# Patient Record
Sex: Female | Born: 1970 | Race: Black or African American | Hispanic: No | Smoking: Current every day smoker
Health system: Southern US, Community
[De-identification: ages and names within clinical notes are randomized; demographics above are authoritative.]

## PROBLEM LIST (undated history)

## (undated) ENCOUNTER — Inpatient Hospital Stay (HOSPITAL_COMMUNITY): Payer: Self-pay

## (undated) DIAGNOSIS — I471 Supraventricular tachycardia, unspecified: Secondary | ICD-10-CM

## (undated) DIAGNOSIS — F419 Anxiety disorder, unspecified: Secondary | ICD-10-CM

## (undated) DIAGNOSIS — I10 Essential (primary) hypertension: Secondary | ICD-10-CM

## (undated) DIAGNOSIS — M545 Low back pain, unspecified: Secondary | ICD-10-CM

## (undated) DIAGNOSIS — F32A Depression, unspecified: Secondary | ICD-10-CM

## (undated) DIAGNOSIS — F329 Major depressive disorder, single episode, unspecified: Secondary | ICD-10-CM

## (undated) HISTORY — PX: THERAPEUTIC ABORTION: SHX798

---

## 1999-03-16 ENCOUNTER — Other Ambulatory Visit: Admission: RE | Admit: 1999-03-16 | Discharge: 1999-03-16 | Payer: Self-pay | Admitting: Obstetrics and Gynecology

## 2000-04-08 ENCOUNTER — Emergency Department (HOSPITAL_COMMUNITY): Admission: EM | Admit: 2000-04-08 | Discharge: 2000-04-08 | Payer: Self-pay | Admitting: Emergency Medicine

## 2003-03-16 ENCOUNTER — Other Ambulatory Visit: Admission: RE | Admit: 2003-03-16 | Discharge: 2003-03-16 | Payer: Self-pay | Admitting: Obstetrics and Gynecology

## 2003-03-17 ENCOUNTER — Other Ambulatory Visit: Admission: RE | Admit: 2003-03-17 | Discharge: 2003-03-17 | Payer: Self-pay | Admitting: Obstetrics and Gynecology

## 2004-05-29 ENCOUNTER — Encounter: Admission: RE | Admit: 2004-05-29 | Discharge: 2004-05-29 | Payer: Self-pay | Admitting: Neurosurgery

## 2006-06-14 ENCOUNTER — Ambulatory Visit: Payer: Self-pay | Admitting: Internal Medicine

## 2006-06-14 LAB — CONVERTED CEMR LAB
ALT: 15 U/L (ref 0–35)
AST: 13 U/L (ref 0–37)
Albumin: 4.1 g/dL (ref 3.5–5.2)
Alkaline Phosphatase: 67 U/L (ref 39–117)
BUN: 8 mg/dL (ref 6–23)
Basophils Absolute: 0 K/uL (ref 0.0–0.1)
Basophils Relative: 1 % (ref 0–1)
Bilirubin, Direct: 0.1 mg/dL (ref 0.0–0.3)
CO2: 25 meq/L (ref 19–32)
Calcium: 8.9 mg/dL (ref 8.4–10.5)
Chloride: 104 meq/L (ref 96–112)
Creatinine, Ser: 0.63 mg/dL (ref 0.40–1.20)
Eosinophils Absolute: 0.2 K/uL (ref 0.0–0.7)
Eosinophils Relative: 3 % (ref 0–5)
Glucose, Bld: 77 mg/dL (ref 70–99)
HCT: 43.2 % (ref 36.0–46.0)
Hemoglobin: 13.8 g/dL (ref 12.0–15.0)
Indirect Bilirubin: 0.4 mg/dL (ref 0.0–0.9)
Lymphocytes Relative: 44 % (ref 12–46)
Lymphs Abs: 2.5 K/uL (ref 0.7–3.3)
MCHC: 31.9 g/dL (ref 30.0–36.0)
MCV: 95.6 fL (ref 78.0–100.0)
Monocytes Absolute: 0.5 K/uL (ref 0.2–0.7)
Monocytes Relative: 8 % (ref 3–11)
Neutro Abs: 2.5 K/uL (ref 1.7–7.7)
Neutrophils Relative %: 44 % (ref 43–77)
Platelets: 365 K/uL (ref 150–400)
Potassium: 4.3 meq/L (ref 3.5–5.3)
RBC: 4.52 M/uL (ref 3.87–5.11)
RDW: 12.4 % (ref 11.5–14.0)
Sodium: 140 meq/L (ref 135–145)
TSH: 1.133 u[IU]/mL (ref 0.350–5.50)
Total Bilirubin: 0.5 mg/dL (ref 0.3–1.2)
Total Protein: 6.7 g/dL (ref 6.0–8.3)
WBC: 5.7 10*3/microliter (ref 4.0–10.5)

## 2006-06-27 ENCOUNTER — Encounter: Payer: Self-pay | Admitting: Cardiology

## 2006-06-27 ENCOUNTER — Ambulatory Visit: Payer: Self-pay

## 2006-07-04 ENCOUNTER — Ambulatory Visit: Payer: Self-pay | Admitting: Internal Medicine

## 2006-08-02 ENCOUNTER — Encounter: Payer: Self-pay | Admitting: Internal Medicine

## 2006-08-02 ENCOUNTER — Ambulatory Visit: Payer: Self-pay | Admitting: Internal Medicine

## 2006-08-02 DIAGNOSIS — R030 Elevated blood-pressure reading, without diagnosis of hypertension: Secondary | ICD-10-CM

## 2006-08-02 DIAGNOSIS — G43909 Migraine, unspecified, not intractable, without status migrainosus: Secondary | ICD-10-CM | POA: Insufficient documentation

## 2006-08-02 DIAGNOSIS — F411 Generalized anxiety disorder: Secondary | ICD-10-CM | POA: Insufficient documentation

## 2006-09-04 ENCOUNTER — Ambulatory Visit: Payer: Self-pay | Admitting: Internal Medicine

## 2006-09-04 ENCOUNTER — Encounter: Payer: Self-pay | Admitting: Internal Medicine

## 2006-09-04 LAB — CONVERTED CEMR LAB
Calcium: 9 mg/dL (ref 8.4–10.5)
Chloride: 104 meq/L (ref 96–112)
GFR calc non Af Amer: 100 mL/min
Glucose, Bld: 74 mg/dL (ref 70–99)

## 2011-01-12 ENCOUNTER — Emergency Department (HOSPITAL_COMMUNITY)
Admission: EM | Admit: 2011-01-12 | Discharge: 2011-01-12 | Disposition: A | Discharge status: Home / Self Care | Payer: Self-pay | Attending: Emergency Medicine | Admitting: Emergency Medicine

## 2011-01-12 ENCOUNTER — Emergency Department (HOSPITAL_COMMUNITY): Payer: Self-pay

## 2011-01-12 DIAGNOSIS — R109 Unspecified abdominal pain: Secondary | ICD-10-CM | POA: Insufficient documentation

## 2011-01-12 DIAGNOSIS — R0789 Other chest pain: Secondary | ICD-10-CM | POA: Insufficient documentation

## 2011-01-12 DIAGNOSIS — R05 Cough: Secondary | ICD-10-CM | POA: Insufficient documentation

## 2011-01-12 DIAGNOSIS — R059 Cough, unspecified: Secondary | ICD-10-CM | POA: Insufficient documentation

## 2011-01-12 DIAGNOSIS — J189 Pneumonia, unspecified organism: Secondary | ICD-10-CM | POA: Insufficient documentation

## 2011-01-12 LAB — POCT PREGNANCY, URINE: Preg Test, Ur: NEGATIVE

## 2011-01-12 LAB — COMPREHENSIVE METABOLIC PANEL
ALT: 16 U/L (ref 0–35)
AST: 12 U/L (ref 0–37)
Albumin: 3.6 g/dL (ref 3.5–5.2)
Alkaline Phosphatase: 92 U/L (ref 39–117)
Potassium: 3.2 mEq/L — ABNORMAL LOW (ref 3.5–5.1)
Sodium: 138 mEq/L (ref 135–145)
Total Protein: 7.5 g/dL (ref 6.0–8.3)

## 2011-01-12 LAB — DIFFERENTIAL
Eosinophils Relative: 2 % (ref 0–5)
Lymphocytes Relative: 24 % (ref 12–46)
Lymphs Abs: 2.4 10*3/uL (ref 0.7–4.0)
Monocytes Relative: 11 % (ref 3–12)
Neutro Abs: 6.2 10*3/uL (ref 1.7–7.7)

## 2011-01-12 LAB — CBC
HCT: 39.4 % (ref 36.0–46.0)
Hemoglobin: 13.1 g/dL (ref 12.0–15.0)
MCH: 30.8 pg (ref 26.0–34.0)
MCHC: 33.2 g/dL (ref 30.0–36.0)
RBC: 4.26 MIL/uL (ref 3.87–5.11)

## 2011-01-12 LAB — URINALYSIS, ROUTINE W REFLEX MICROSCOPIC
Hgb urine dipstick: NEGATIVE
Leukocytes, UA: NEGATIVE
Nitrite: NEGATIVE
Specific Gravity, Urine: 1.035 — ABNORMAL HIGH (ref 1.005–1.030)
Urobilinogen, UA: 0.2 mg/dL (ref 0.0–1.0)

## 2011-09-11 ENCOUNTER — Emergency Department (HOSPITAL_COMMUNITY): Admission: EM | Admit: 2011-09-11 | Discharge: 2011-09-11 | Discharge status: Against Medical Advice | Payer: Self-pay

## 2011-09-11 NOTE — Progress Notes (Signed)
Pt listed as self pay with no insurance coverage Pt confirms she is self pay [place] resident.  CM and [facility] community liaison spoke with her Pt offered [facility] services to assist with finding a [place] self pay provider Pt states she went to [facility] prior to coming to [facility] ED with a form of [employer] coverage per pt Accepted [employer] information

## 2011-09-12 ENCOUNTER — Encounter (HOSPITAL_COMMUNITY): Payer: Self-pay | Admitting: Emergency Medicine

## 2011-09-12 ENCOUNTER — Emergency Department (INDEPENDENT_AMBULATORY_CARE_PROVIDER_SITE_OTHER)
Admission: EM | Admit: 2011-09-12 | Discharge: 2011-09-12 | Disposition: A | Discharge status: Home / Self Care | Payer: Self-pay | Source: Home / Self Care | Attending: Family Medicine | Admitting: Family Medicine

## 2011-09-12 DIAGNOSIS — I10 Essential (primary) hypertension: Secondary | ICD-10-CM

## 2011-09-12 HISTORY — DX: Essential (primary) hypertension: I10

## 2011-09-12 LAB — POCT I-STAT, CHEM 8
BUN: 9 mg/dL (ref 6–23)
Creatinine, Ser: 0.8 mg/dL (ref 0.50–1.10)
Glucose, Bld: 85 mg/dL (ref 70–99)
Hemoglobin: 14.6 g/dL (ref 12.0–15.0)
Potassium: 3.8 mEq/L (ref 3.5–5.1)

## 2011-09-12 MED ORDER — LISINOPRIL-HYDROCHLOROTHIAZIDE 20-12.5 MG PO TABS: 1.0000 | ORAL_TABLET | Freq: Every day | ORAL | Status: DC

## 2011-09-12 NOTE — ED Notes (Signed)
C/o of headache on and off for a month . jSeen by OBGYN on Friday and was told that her b/p was high

## 2011-09-12 NOTE — Discharge Instructions (Signed)
Take medicine as prescribed, no more smoking, reduce salt use , see your doctor for recheck.Go to www.goodrx.com to look up your medications. This will give you a list of where you can find your prescriptions at the most affordable prices.   Call [facility]  [phone]  If you have no primary doctor, here are some resources that may be helpful:  Medicaid-accepting [facility] Providers:   - [facility]- [address]      [phone]      Mon-Fri 9am-7pm, Sat 9am-1pm   - [facility]- [address] [place], [place]      [phone]   - [facility]- [address], [address] [place]      [phone]   [facility]- [address]      [phone]   - Renaye Rakers- [address] [place], [address]      [phone]      Only accepts [place] patients       after they have her name applied to their card   Self Pay (no insurance) in [place]:   - Sickle Cell Patients: Dr Willey Blade, [facility]      [address] [place]      [phone]   - [facility] [phone]   - Physician Referral Service- [phone]   - [facility]- [address] [place]      [phone]   [facility] [place]- [address]   - [facility]- see information above      (Speak to [employer] if you do not have insurance)   - [facility]- [address] [place]      [phone]   - [facility] [place]- [address]      [phone]   - [facility]- [address]      [phone]   - Dr Julio Sicks-  [address], [address], [place]      [phone]   - [facility]- [address]      [phone]   - [facility]- [address]      [phone]      Also [address]      [phone]   - [facility]- [address]      [phone]      1st and 3rd Saturday every month, 10am-1pm    Other  agencies that provide inexpensive medical care:     [facility]  [phone]    [facility]  [phone]    [facility]  [phone]    [facility]  [phone] [address] [place] [address]    [facility]  [phone]    [facility]  [phone] [facility] [phone]   [address] [place], [place] [address]  Chronic Pain Problems Contact [facility]  [phone] Patients need to be referred by their primary care doctor.  [facility]  [facility]     [employer]                          [facility]. [address] [place]  [address]      [address] [place] [address]   [phone] (After Hours)  General Information: Finding a doctor when you do not have health insurance can be tricky. Although you are not limited by an insurance plan, you are of course limited by her finances and how much but he can pay out of pocket.  What are your options if you don't have health insurance?   1) Find a Librarian, academic and Pay Out of Pocket Although you won't have to find out who is covered by your insurance plan, it is a good idea to ask around and get recommendations. You will then need to call the office and see if the doctor you have chosen will accept you as a new patient and what types of options they offer for patients who are self-pay. Some doctors offer discounts or will set up payment plans for their patients who do not have insurance, but you will need to ask so you aren't surprised when you get to your appointment.  2) Contact Your Local Health Department Not all health departments have doctors that can see patients for sick visits, but many do, so it is worth a call to see if yours does. If you don't know where your local health department is, you can check in your phone book. The CDC also has a tool to  help you locate your state's health department, and many state websites also have listings of all of their local health departments.  3) Find a Walk-in Clinic If your illness is not likely to be very severe or complicated, you may want to try a walk in clinic. These are popping up all over the country in pharmacies, drugstores, and shopping centers. They're usually staffed by nurse practitioners or physician assistants that have been trained to treat common illnesses and complaints. They're usually fairly quick and inexpensive. However, if you have serious medical issues or chronic medical problems, these are probably not your best option

## 2011-09-12 NOTE — ED Provider Notes (Signed)
History     CSN: [id number]  Arrival date & time 09/12/11  1543   First MD Initiated Contact with Patient 09/12/11 1559      Chief Complaint  Patient presents with  . Headache    (Consider location/radiation/quality/duration/timing/severity/associated sxs/prior treatment) Patient is a 41 y.o. female presenting with hypertension. The history is provided by the patient.  Hypertension This is a recurrent problem. The current episode started more than 2 days ago (seen by gyn last week and told to seek rx for high bp, h/o same 4 yr ago and on meds but stopped from lost job.). The problem has not changed since onset.Associated symptoms include headaches. Pertinent negatives include no chest pain, no abdominal pain and no shortness of breath. Associated symptoms comments: Pt is a smoker and father died from CVA at age 66.    Past Medical History  Diagnosis Date  . Hypertension     History reviewed. No pertinent past surgical history.  No family history on file.  History  Substance Use Topics  . Smoking status: Current Everyday Smoker -- 0.5 packs/day  . Smokeless tobacco: Not on file  . Alcohol Use: Yes     occasionally    OB History    Grav Para Term Preterm Abortions TAB SAB Ect Mult Living                  Review of Systems  Constitutional: Negative.   Respiratory: Negative for chest tightness and shortness of breath.   Cardiovascular: Negative for chest pain.  Gastrointestinal: Negative.  Negative for abdominal pain.  Neurological: Positive for headaches.    Allergies  Review of patient's allergies indicates no known allergies.  Home Medications   Current Outpatient Rx  Name Route Sig Dispense Refill  . METRONIDAZOLE 250 MG PO TABS Oral Take 250 mg by mouth 3 (three) times daily.    . MULTI-VITAMIN/MINERALS PO TABS Oral Take 1 tablet by mouth daily.    Marland Kitchen LISINOPRIL-HYDROCHLOROTHIAZIDE 20-12.5 MG PO TABS Oral Take 1 tablet by mouth daily. 30 tablet 1    BP  190/166  Pulse 78  Temp(Src) 99.2 F (37.3 C) (Oral)  Resp 18  SpO2 100%  LMP 09/07/2011  Physical Exam  Nursing note and vitals reviewed. Constitutional: She is oriented to person, place, and time. She appears well-developed and well-nourished.  HENT:  Head: Normocephalic.  Mouth/Throat: Oropharynx is clear and moist.  Eyes: Conjunctivae and EOM are normal. Pupils are equal, round, and reactive to light.  Neck: Normal range of motion. Neck supple. No thyromegaly present.  Cardiovascular: Normal rate, regular rhythm, normal heart sounds and intact distal pulses.   Pulmonary/Chest: Effort normal and breath sounds normal.  Musculoskeletal: She exhibits no edema.  Lymphadenopathy:    She has no cervical adenopathy.  Neurological: She is alert and oriented to person, place, and time.  Skin: Skin is warm and dry.    ED Course  Procedures (including critical care time)   Labs Reviewed  POCT I-STAT, CHEM 8   No results found.   1. Hypertension, uncontrolled       MDM  i-stat wnl.        Linna Hoff, MD 09/12/11 [phone]

## 2012-10-22 ENCOUNTER — Encounter (HOSPITAL_COMMUNITY): Payer: Self-pay | Admitting: Advanced Practice Midwife

## 2012-10-22 ENCOUNTER — Inpatient Hospital Stay (HOSPITAL_COMMUNITY)
Admission: AD | Admit: 2012-10-22 | Discharge: 2012-10-23 | Disposition: A | Discharge status: Home / Self Care | Payer: Self-pay | Source: Ambulatory Visit | Attending: Obstetrics and Gynecology | Admitting: Obstetrics and Gynecology

## 2012-10-22 DIAGNOSIS — O200 Threatened abortion: Secondary | ICD-10-CM | POA: Insufficient documentation

## 2012-10-22 DIAGNOSIS — R109 Unspecified abdominal pain: Secondary | ICD-10-CM | POA: Insufficient documentation

## 2012-10-22 DIAGNOSIS — R42 Dizziness and giddiness: Secondary | ICD-10-CM | POA: Insufficient documentation

## 2012-10-22 HISTORY — DX: Depression, unspecified: F32.A

## 2012-10-22 HISTORY — DX: Major depressive disorder, single episode, unspecified: F32.9

## 2012-10-22 HISTORY — DX: Anxiety disorder, unspecified: F41.9

## 2012-10-22 LAB — COMPREHENSIVE METABOLIC PANEL
ALT: 13 U/L (ref 0–35)
CO2: 25 mEq/L (ref 19–32)
Calcium: 9.5 mg/dL (ref 8.4–10.5)
Creatinine, Ser: 0.63 mg/dL (ref 0.50–1.10)
GFR calc Af Amer: >90 mL/min (ref >90)
GFR calc non Af Amer: >90 mL/min (ref >90)
Glucose, Bld: 83 mg/dL (ref 70–99)
Total Bilirubin: 0.2 mg/dL — ABNORMAL LOW (ref 0.3–1.2)

## 2012-10-22 LAB — CBC
Hemoglobin: 13 g/dL (ref 12.0–15.0)
MCH: 31.3 pg (ref 26.0–34.0)
MCV: 91.3 fL (ref 78.0–100.0)
RBC: 4.16 MIL/uL (ref 3.87–5.11)

## 2012-10-22 LAB — URINALYSIS, ROUTINE W REFLEX MICROSCOPIC
Bilirubin Urine: NEGATIVE
Glucose, UA: NEGATIVE mg/dL
Specific Gravity, Urine: 1.015 (ref 1.005–1.030)
Urobilinogen, UA: 1 mg/dL (ref 0.0–1.0)

## 2012-10-22 LAB — POCT PREGNANCY, URINE: Preg Test, Ur: POSITIVE — AB

## 2012-10-22 LAB — URINE MICROSCOPIC-ADD ON: WBC, UA: NONE SEEN WBC/hpf (ref <3)

## 2012-10-22 NOTE — MAU Provider Note (Signed)
Chief Complaint: Vaginal Bleeding, Dizziness and Abdominal Pain    First Provider Initiated Contact with Patient 10/22/12 2322     SUBJECTIVE HPI: Laura Summers is a 42 y.o. G1P0 at [redacted]w[redacted]d by LMP who presents with gradual onset of mild cramping over the last week or 2 and small amount of dark red bleeding since yesterday. Also reports several episodes of dizziness while at rest. Positive home UPT proximally 4 weeks ago. Denies fever, chills, syncope, passage of tissue, urinary complaints, GI complaints. Has not had any other testing this pregnancy.  Past Medical History  Diagnosis Date  . Hypertension    OB History   Grav Para Term Preterm Abortions TAB SAB Ect Mult Living   1              # Outc Date GA Lbr Len/2nd Wgt Sex Del Anes PTL Lv   1 CUR              No past surgical history on file. History   Social History  . Marital Status: Divorced    Spouse Name: N/A    Number of Children: N/A  . Years of Education: N/A   Occupational History  . Not on file.   Social History Main Topics  . Smoking status: Current Every Day Smoker -- 0.50 packs/day  . Smokeless tobacco: Not on file  . Alcohol Use: Yes     Comment: occasionally  . Drug Use:   . Sexually Active:    Other Topics Concern  . Not on file   Social History Narrative  . No narrative on file   No current facility-administered medications on file prior to encounter.   Current Outpatient Prescriptions on File Prior to Encounter  Medication Sig Dispense Refill  . lisinopril-hydrochlorothiazide (PRINZIDE,ZESTORETIC) 20-12.5 MG per tablet Take 1 tablet by mouth daily.  30 tablet  1  . metroNIDAZOLE (FLAGYL) 250 MG tablet Take 250 mg by mouth 3 (three) times daily.      . Multiple Vitamins-Minerals (MULTIVITAMIN WITH MINERALS) tablet Take 1 tablet by mouth daily.       No Known Allergies  ROS: Pertinent items in HPI  OBJECTIVE Blood pressure 139/74, pulse 70, temperature 98.8 F (37.1 C), temperature source  Oral, resp. rate 18, height 5\' 10"  (1.778 m), weight 81.829 kg (180 lb 6.4 oz), last menstrual period 08/12/2012. GENERAL: Well-developed, well-nourished female in no acute distress.  HEENT: Normocephalic HEART: normal rate RESP: normal effort ABDOMEN: Soft, non-tender EXTREMITIES: Nontender, no edema NEURO: Alert and oriented SPECULUM EXAM: NEFG, small amount of dark red blood noted coming from os, cervix clean BIMANUAL: cervix closed, very posterior; uterus slightly enlarged, no adnexal tenderness or masses. No CMT.  LAB RESULTS Results for orders placed during the hospital encounter of 10/22/12 (from the past 24 hour(s))  URINALYSIS, ROUTINE W REFLEX MICROSCOPIC     Status: Abnormal   Collection Time    10/22/12  8:45 PM      Result Value Range   Color, Urine YELLOW  YELLOW   APPearance CLEAR  CLEAR   Specific Gravity, Urine 1.015  1.005 - 1.030   pH 7.5  5.0 - 8.0   Glucose, UA NEGATIVE  NEGATIVE mg/dL   Hgb urine dipstick TRACE (*) NEGATIVE   Bilirubin Urine NEGATIVE  NEGATIVE   Ketones, ur NEGATIVE  NEGATIVE mg/dL   Protein, ur NEGATIVE  NEGATIVE mg/dL   Urobilinogen, UA 1.0  0.0 - 1.0 mg/dL   Nitrite NEGATIVE  NEGATIVE  Leukocytes, UA NEGATIVE  NEGATIVE  URINE MICROSCOPIC-ADD ON     Status: Abnormal   Collection Time    10/22/12  8:45 PM      Result Value Range   Squamous Epithelial / LPF RARE  RARE   WBC, UA    <3 WBC/hpf   Value: NO FORMED ELEMENTS SEEN ON URINE MICROSCOPIC EXAMINATION   RBC / HPF 0-2  <3 RBC/hpf   Bacteria, UA FEW (*) RARE  POCT PREGNANCY, URINE     Status: Abnormal   Collection Time    10/22/12  8:54 PM      Result Value Range   Preg Test, Ur POSITIVE (*) NEGATIVE  ABO/RH     Status: None   Collection Time    10/22/12 11:25 PM      Result Value Range   ABO/RH(D) A POS    HCG, QUANTITATIVE, PREGNANCY     Status: Abnormal   Collection Time    10/22/12 11:25 PM      Result Value Range   hCG, Beta Chain, Quant, S 17950 (*) <5 mIU/mL  CBC      Status: None   Collection Time    10/22/12 11:25 PM      Result Value Range   WBC 6.5  4.0 - 10.5 K/uL   RBC 4.16  3.87 - 5.11 MIL/uL   Hemoglobin 13.0  12.0 - 15.0 g/dL   HCT 96.0  45.4 - 09.8 %   MCV 91.3  78.0 - 100.0 fL   MCH 31.3  26.0 - 34.0 pg   MCHC 34.2  30.0 - 36.0 g/dL   RDW 11.9  14.7 - 82.9 %   Platelets 282  150 - 400 K/uL  COMPREHENSIVE METABOLIC PANEL     Status: Abnormal   Collection Time    10/22/12 11:25 PM      Result Value Range   Sodium 136  135 - 145 mEq/L   Potassium 3.7  3.5 - 5.1 mEq/L   Chloride 102  96 - 112 mEq/L   CO2 25  19 - 32 mEq/L   Glucose, Bld 83  70 - 99 mg/dL   BUN 7  6 - 23 mg/dL   Creatinine, Ser 5.62  0.50 - 1.10 mg/dL   Calcium 9.5  8.4 - 13.0 mg/dL   Total Protein 6.6  6.0 - 8.3 g/dL   Albumin 3.7  3.5 - 5.2 g/dL   AST 12  0 - 37 U/L   ALT 13  0 - 35 U/L   Alkaline Phosphatase 56  39 - 117 U/L   Total Bilirubin 0.2 (*) 0.3 - 1.2 mg/dL   GFR calc non Af Amer >90  >90 mL/min   GFR calc Af Amer >90  >90 mL/min  WET PREP, GENITAL     Status: Abnormal   Collection Time    10/23/12  2:10 AM      Result Value Range   Yeast Wet Prep HPF POC NONE SEEN  NONE SEEN   Trich, Wet Prep NONE SEEN  NONE SEEN   Clue Cells Wet Prep HPF POC FEW (*) NONE SEEN   WBC, Wet Prep HPF POC NONE SEEN  NONE SEEN    IMAGING US Ob Comp Less 14 Wks  10/23/2012   *RADIOLOGY REPORT*  Clinical Data: 10 weeks 1 day by LMP, cramping x2 weeks, bleeding,Same; ;  OBSTETRIC <14 WK [place] AND TRANSVAGINAL OB US  Technique: Both transabdominal and transvaginal ultrasound examinations were performed  for complete evaluation of the gestation as well as the maternal uterus, adnexal regions, and pelvic cul-de-sac.  Comparison: None.  Findings: There is a single intrauterine gestation questionable to yolk sacs.  Single fetal pole which measures 2.9 mm for an estimated gestational age of [redacted] weeks 0 days.  No fetal heartbeat detected currently.  Large subchorionic hemorrhage noted.   Ovaries are symmetric in size and echotexture.  No adnexal masses. No free fluid.  2 cm left posterior fundal fibroid noted.  IMPRESSION: 6-week-0-day intrauterine pregnancy. No fetal heartbeat detectable currently.  Question two yolk sacs.  Large subchorionic hemorrhage. Recommend follow up with serial quantitative beta HCGs and ultrasounds.   Original Report Authenticated By: Charlett Nose, M.D.   US Ob Transvaginal  10/23/2012   *RADIOLOGY REPORT*  Clinical Data: 10 weeks 1 day by LMP, cramping x2 weeks, bleeding,Same; ;  OBSTETRIC <14 WK [place] AND TRANSVAGINAL OB US  Technique: Both transabdominal and transvaginal ultrasound examinations were performed for complete evaluation of the gestation as well as the maternal uterus, adnexal regions, and pelvic cul-de-sac.  Comparison: None.  Findings: There is a single intrauterine gestation questionable to yolk sacs.  Single fetal pole which measures 2.9 mm for an estimated gestational age of [redacted] weeks 0 days.  No fetal heartbeat detected currently.  Large subchorionic hemorrhage noted.  Ovaries are symmetric in size and echotexture.  No adnexal masses. No free fluid.  2 cm left posterior fundal fibroid noted.  IMPRESSION: 6-week-0-day intrauterine pregnancy. No fetal heartbeat detectable currently.  Question two yolk sacs.  Large subchorionic hemorrhage. Recommend follow up with serial quantitative beta HCGs and ultrasounds.   Original Report Authenticated By: Charlett Nose, M.D.    MAU COURSE  ASSESSMENT 1. Threatened miscarriage   2. Dizziness     PLAN Discharge home in stable condition. SAB precautions. Pelvic rest.  Follow-up Information   Follow up with [facility] [place] ULTRASOUND In 1 week. (Will call you to scheduled appointment)    Contact information:   [address] [id number] [place] [address] [phone]      Follow up with THE [facility] [place] MATERNITY ADMISSIONS. (As needed if symptoms  worsen)    Contact information:   [address] [id number] [place] [address] [phone]        Medication List    STOP taking these medications       aspirin 325 MG tablet     lisinopril-hydrochlorothiazide 20-12.5 MG per tablet  Commonly known as:  PRINZIDE,ZESTORETIC     metroNIDAZOLE 250 MG tablet  Commonly known as:  FLAGYL      TAKE these medications       multivitamin with minerals tablet  Take 1 tablet by mouth daily.       [place], CNM 10/22/2012  2:26 AM

## 2012-10-22 NOTE — MAU Note (Signed)
Pt reports having small amount of dark red vaginal bleeding and cramping on and off for 2 weeks. Had several episodes of dizziness. Last one earlier today. Triage nurse told her to come in and get checked.

## 2012-10-23 ENCOUNTER — Encounter (HOSPITAL_COMMUNITY): Payer: Self-pay | Admitting: Radiology

## 2012-10-23 ENCOUNTER — Inpatient Hospital Stay (HOSPITAL_COMMUNITY): Payer: Self-pay

## 2012-10-23 DIAGNOSIS — O200 Threatened abortion: Secondary | ICD-10-CM

## 2012-10-23 LAB — WET PREP, GENITAL
Trich, Wet Prep: NONE SEEN
Yeast Wet Prep HPF POC: NONE SEEN

## 2012-10-23 LAB — ABO/RH: ABO/RH(D): A POS

## 2012-10-23 LAB — HCG, QUANTITATIVE, PREGNANCY: hCG, Beta Chain, Quant, S: 17950 m[IU]/mL — ABNORMAL HIGH (ref <5)

## 2012-10-24 LAB — GC/CHLAMYDIA PROBE AMP
CT Probe RNA: NEGATIVE
GC Probe RNA: NEGATIVE

## 2012-10-26 NOTE — MAU Provider Note (Signed)
Attestation of Attending Supervision of Advanced Practitioner: Evaluation and management procedures were performed by the PA/NP/CNM/OB Fellow under my supervision/collaboration. Chart reviewed and agree with management and plan.  Laura Summers V 10/26/2012 5:56 PM

## 2012-10-30 ENCOUNTER — Ambulatory Visit (HOSPITAL_COMMUNITY)
Admission: RE | Admit: 2012-10-30 | Discharge: 2012-10-30 | Disposition: A | Discharge status: Home / Self Care | Payer: Medicaid Other | Source: Ambulatory Visit | Attending: Advanced Practice Midwife | Admitting: Advanced Practice Midwife

## 2012-10-30 ENCOUNTER — Inpatient Hospital Stay (HOSPITAL_COMMUNITY)
Admission: AD | Admit: 2012-10-30 | Discharge: 2012-10-30 | Disposition: A | Discharge status: Home / Self Care | Payer: Medicaid Other | Source: Ambulatory Visit | Attending: Obstetrics & Gynecology | Admitting: Obstetrics & Gynecology

## 2012-10-30 DIAGNOSIS — O209 Hemorrhage in early pregnancy, unspecified: Secondary | ICD-10-CM | POA: Insufficient documentation

## 2012-10-30 DIAGNOSIS — O3680X0 Pregnancy with inconclusive fetal viability, not applicable or unspecified: Secondary | ICD-10-CM | POA: Insufficient documentation

## 2012-10-30 DIAGNOSIS — O200 Threatened abortion: Secondary | ICD-10-CM

## 2012-10-30 DIAGNOSIS — O09529 Supervision of elderly multigravida, unspecified trimester: Secondary | ICD-10-CM | POA: Insufficient documentation

## 2012-10-30 DIAGNOSIS — O034 Incomplete spontaneous abortion without complication: Secondary | ICD-10-CM | POA: Insufficient documentation

## 2012-10-30 LAB — COMPREHENSIVE METABOLIC PANEL
ALT: 11 U/L (ref 0–35)
AST: 14 U/L (ref 0–37)
Alkaline Phosphatase: 54 U/L (ref 39–117)
GFR calc Af Amer: >90 mL/min (ref >90)
Glucose, Bld: 80 mg/dL (ref 70–99)
Potassium: 4 mEq/L (ref 3.5–5.1)
Sodium: 137 mEq/L (ref 135–145)
Total Protein: 7.1 g/dL (ref 6.0–8.3)

## 2012-10-30 LAB — CBC
Hemoglobin: 13.3 g/dL (ref 12.0–15.0)
MCHC: 33.9 g/dL (ref 30.0–36.0)
Platelets: 311 10*3/uL (ref 150–400)
RBC: 4.3 MIL/uL (ref 3.87–5.11)

## 2012-10-30 MED ORDER — ACETAMINOPHEN-CODEINE 300-30 MG PO TABS: 1.0000 | ORAL_TABLET | ORAL | Status: DC | PRN

## 2012-10-30 MED ORDER — IBUPROFEN 600 MG PO TABS: 600.0000 mg | ORAL_TABLET | Freq: Four times a day (QID) | ORAL | Status: DC | PRN

## 2012-10-30 MED ORDER — MISOPROSTOL 200 MCG PO TABS: 800.0000 ug | ORAL_TABLET | Freq: Once | ORAL | Status: DC

## 2012-10-30 NOTE — MAU Provider Note (Signed)
  History     CSN: [id number]  Arrival date and time: 10/30/12 1739   None     Chief Complaint  Patient presents with  . Follow-up   HPI  Pt is a [id number] here with 1st trimester pregnancy for follow-up ultrasound.  Pt seen on 6/25 for cramping and bleeding.  BHCG was 13086, ultrasound showed fetal pole, no cardiac activity.  Large subchorionic hemorrhage noted.  Here for follow-up ultrasound.    Past Medical History  Diagnosis Date  . Hypertension   . Anxiety   . Depression     No past surgical history on file.  No family history on file.  History  Substance Use Topics  . Smoking status: Never Smoker   . Smokeless tobacco: Not on file  . Alcohol Use: Yes     Comment: occasionally    Allergies: No Known Allergies  Prescriptions prior to admission  Medication Sig Dispense Refill  . Multiple Vitamins-Minerals (MULTIVITAMIN WITH MINERALS) tablet Take 1 tablet by mouth daily.        ROS See HPI Physical Exam   Blood pressure 132/76, pulse 59, temperature 98.7 F (37.1 C), temperature source Oral, resp. rate 16, last menstrual period 08/12/2012, SpO2 100.00%.  Physical Exam  Constitutional: She is oriented to person, place, and time. She appears well-developed and well-nourished. No distress.  HENT:  Head: Normocephalic.  Neck: Normal range of motion. Neck supple.  Neurological: She is alert and oriented to person, place, and time. She has normal reflexes.  Skin: Skin is warm and dry.    MAU Course  Procedures  Ultrasound:   Assessment and Plan  Incomplete Miscarriage  Plan: After reviewed options pt decides to do cytotec. Early Intrauterine Pregnancy Failure Protocol X  Documented intrauterine pregnancy failure less than or equal to [redacted] weeks   gestation  X  No serious current illness  X  Baseline Hgb greater than or equal to 10g/dl  X  Patient has easily accessible transportation to the hospital  X  Clear preference  X  Practitioner/physician deems  patient reliable  X  Counseling by practitioner or physician  X  Patient education by RN  X  Consent form signed  X  Cytotec 800 mcg Intravaginally by patient at home  X   Ibuprofen 600 mg 1 tablet by mouth every 6 hours as needed #30 - prescribed  X   Tylenol #3 mg by mouth every 4 to 6 hours as needed - prescribed   Reviewed with pt cytotec procedure.  Pt verbalizes that she lives close to the hospital and has transportation readily available.  Pt appears reliable and verbalizes understanding and agrees with plan of care   [facility] 10/30/2012, 7:03 PM

## 2012-10-30 NOTE — MAU Note (Signed)
Patient to MAU after ultrasound. Patient denies pain but does continue to have a little spotting.

## 2012-11-13 ENCOUNTER — Encounter: Payer: Self-pay | Admitting: Obstetrics & Gynecology

## 2012-11-13 ENCOUNTER — Inpatient Hospital Stay (HOSPITAL_COMMUNITY)
Admission: AD | Admit: 2012-11-13 | Discharge: 2012-11-13 | Disposition: A | Discharge status: Home / Self Care | Payer: Self-pay | Source: Ambulatory Visit | Attending: Obstetrics & Gynecology | Admitting: Obstetrics & Gynecology

## 2012-11-13 ENCOUNTER — Ambulatory Visit (INDEPENDENT_AMBULATORY_CARE_PROVIDER_SITE_OTHER): Payer: Self-pay | Admitting: Obstetrics & Gynecology

## 2012-11-13 VITALS — BP 148/101 | HR 85 | Temp 99.5°F | Ht 70.0 in | Wt 178.1 lb

## 2012-11-13 DIAGNOSIS — O039 Complete or unspecified spontaneous abortion without complication: Secondary | ICD-10-CM | POA: Insufficient documentation

## 2012-11-13 DIAGNOSIS — Z1239 Encounter for other screening for malignant neoplasm of breast: Secondary | ICD-10-CM

## 2012-11-13 NOTE — Patient Instructions (Signed)
Mammogram A mammogram is an X-ray test to find changes in a woman's breast. You should get a mammogram if:  You are over 44 years of age.   You have risk factors.   Your doctor recommends that you have one.  BEFORE THE TEST  Do not schedule the test the week before your period, especially if your breasts are sore during this time.  On the day of your mammogram:  Wash your breasts and armpits well. After washing, do not put on any deodorant or talcum powder on until after your test.   Eat and drink as you usually do.   Take your medicines as usual.   If you are diabetic and take insulin, make sure you:   Eat before coming for your test.   Take your insulin as usual.   If you cannot keep your appointment, call before the appointment to cancel. Schedule another appointment.  TEST  You will need to undress from the waist up. You will put on a hospital gown.   Your breast will be put on the mammogram machine, and it will press firmly on your breast with a piece of plastic called a compression paddle. This will make your breast flatter so that the machine can X-ray all parts of your breast.   Both breasts will be X-rayed. Each breast will be X-rayed from above and from the side. An X-ray might need to be taken again if the picture is not good enough.   The mammogram will last about 15 to 30 minutes.  AFTER THE TEST Finding out the results of your test Ask when your test results will be ready. Make sure you get your test results. Document Released: 07/13/2008 Document Revised: 04/05/2011 Document Reviewed: 07/13/2008 ExitCare Patient Information 2012 [employer]. 

## 2012-11-13 NOTE — Progress Notes (Signed)
Had bleeding for a week after SAB and just starting this week, spotting brown d/c

## 2012-11-13 NOTE — Progress Notes (Signed)
Patient ID: Laura Summers, female   DOB: 1970/10/01, 42 y.o.   MRN: [id number]  Chief Complaint  Patient presents with  . Follow-up    for SAB    HPI Laura Summers is a 42 y.o. female.  S/p Sab, given cytotec with completed abortion 2 weeks agor, slight spotting now, no pain  HPI  Past Medical History  Diagnosis Date  . Hypertension   . Anxiety   . Depression     Past Surgical History  Procedure Laterality Date  . Therapeutic abortion      History reviewed. No pertinent family history.  Social History History  Substance Use Topics  . Smoking status: Current Every Day Smoker -- 0.50 packs/day  . Smokeless tobacco: Not on file  . Alcohol Use: Yes     Comment: occasionally    No Known Allergies  Current Outpatient Prescriptions  Medication Sig Dispense Refill  . aspirin 325 MG tablet Take 325 mg by mouth daily.      . Multiple Vitamins-Minerals (MULTIVITAMIN WITH MINERALS) tablet Take 1 tablet by mouth daily.      . Acetaminophen-Codeine (TYLENOL/CODEINE #3) 300-30 MG per tablet Take 1 tablet by mouth every 4 (four) hours as needed for pain.  15 tablet  0  . ibuprofen (ADVIL,MOTRIN) 600 MG tablet Take 1 tablet (600 mg total) by mouth every 6 (six) hours as needed for pain.  20 tablet  0   No current facility-administered medications for this visit.    Review of Systems Review of Systems  Genitourinary: Positive for vaginal bleeding (spotting). Negative for vaginal discharge, vaginal pain and menstrual problem.    Blood pressure 148/101, pulse 85, temperature 99.5 F (37.5 C), temperature source Oral, height 5\' 10"  (1.778 m), weight 178 lb 1.6 oz (80.786 kg), last menstrual period 08/12/2012.  Physical Exam Physical Exam  Constitutional: She appears well-developed and well-nourished. No distress.  Pulmonary/Chest: Effort normal. No respiratory distress.  Skin: Skin is warm and dry.  Psychiatric:  Some tears during discussion    Data Reviewed ED  note  Assessment    S/P complete SAb  Does not request birth control    Plan    Mammogram Yearly exam 6 months        ARNOLD,JAMES 11/13/2012, 2:40 PM

## 2012-12-02 ENCOUNTER — Telehealth: Payer: Self-pay | Admitting: *Deleted

## 2012-12-02 NOTE — Telephone Encounter (Addendum)
Pt left voice mail message stating that she had seen Dr. Debroah Loop on 7/17 for fu after SAB. She has now decided that she would like birth control. Please call back- can be reached @ [phone] (W) during the [place] from 8-4pm. I called pt and left message on her personal voice mail that birth control can be prescribed. Please let us know what type she desires- pills, depo provera, etc.  [phone]  Pt left additional message stating that she would like Rx for birth control pills. I sent message to Dr. Debroah Loop for Rx. 8/8  1230  Called pt and informed her that Rx has been sent to her pharmacy for birth control pills. Her LMP was 8/3 and she will begin her pills today or tomorrow. She has not had intercourse for 1 week and was advised to abstain for the first week of OCP's or to use condoms. Pt voiced understanding.

## 2012-12-03 ENCOUNTER — Other Ambulatory Visit: Payer: Self-pay | Admitting: Obstetrics & Gynecology

## 2012-12-03 DIAGNOSIS — Z3009 Encounter for other general counseling and advice on contraception: Secondary | ICD-10-CM

## 2012-12-03 MED ORDER — NORGESTIM-ETH ESTRAD TRIPHASIC 0.18/0.215/0.25 MG-35 MCG PO TABS: 1.0000 | ORAL_TABLET | Freq: Every day | ORAL | Status: DC

## 2013-01-28 ENCOUNTER — Emergency Department (HOSPITAL_COMMUNITY)
Admission: EM | Admit: 2013-01-28 | Discharge: 2013-01-28 | Disposition: A | Discharge status: Home / Self Care | Payer: Medicaid Other | Attending: Emergency Medicine | Admitting: Emergency Medicine

## 2013-01-28 ENCOUNTER — Emergency Department (HOSPITAL_COMMUNITY): Payer: Medicaid Other

## 2013-01-28 ENCOUNTER — Encounter (HOSPITAL_COMMUNITY): Payer: Self-pay

## 2013-01-28 DIAGNOSIS — R51 Headache: Secondary | ICD-10-CM | POA: Insufficient documentation

## 2013-01-28 DIAGNOSIS — R110 Nausea: Secondary | ICD-10-CM | POA: Insufficient documentation

## 2013-01-28 DIAGNOSIS — H53149 Visual discomfort, unspecified: Secondary | ICD-10-CM | POA: Insufficient documentation

## 2013-01-28 DIAGNOSIS — Z8659 Personal history of other mental and behavioral disorders: Secondary | ICD-10-CM | POA: Insufficient documentation

## 2013-01-28 DIAGNOSIS — Z7982 Long term (current) use of aspirin: Secondary | ICD-10-CM | POA: Insufficient documentation

## 2013-01-28 DIAGNOSIS — F17200 Nicotine dependence, unspecified, uncomplicated: Secondary | ICD-10-CM | POA: Insufficient documentation

## 2013-01-28 DIAGNOSIS — I10 Essential (primary) hypertension: Secondary | ICD-10-CM | POA: Insufficient documentation

## 2013-01-28 DIAGNOSIS — Z79899 Other long term (current) drug therapy: Secondary | ICD-10-CM | POA: Insufficient documentation

## 2013-01-28 MED ORDER — KETOROLAC TROMETHAMINE 60 MG/2ML IM SOLN
60.0000 mg | Freq: Once | INTRAMUSCULAR | Status: AC
  Administered 2013-01-28: 60 mg via INTRAMUSCULAR
  Filled 2013-01-28: qty 2

## 2013-01-28 MED ORDER — LISINOPRIL-HYDROCHLOROTHIAZIDE 20-12.5 MG PO TABS: 1.0000 | ORAL_TABLET | Freq: Every day | ORAL | Status: DC

## 2013-01-28 MED ORDER — DIPHENHYDRAMINE HCL 50 MG/ML IJ SOLN
50.0000 mg | Freq: Once | INTRAMUSCULAR | Status: AC
  Administered 2013-01-28: 50 mg via INTRAMUSCULAR
  Filled 2013-01-28: qty 1

## 2013-01-28 MED ORDER — METOCLOPRAMIDE HCL 5 MG/ML IJ SOLN
10.0000 mg | Freq: Once | INTRAMUSCULAR | Status: AC
  Administered 2013-01-28: 10 mg via INTRAMUSCULAR
  Filled 2013-01-28: qty 2

## 2013-01-28 NOTE — ED Notes (Signed)
She c/o h/a today.  She states she had a miscarriage about a month ago, and had taken a hiatus from her b/p meds for a couple of months before that, so she has been off her meds for ~3 months.  She is in no distress.

## 2013-01-28 NOTE — ED Notes (Signed)
Patient transported to CT 

## 2013-01-28 NOTE — ED Provider Notes (Signed)
CSN: [id number]     Arrival date & time 01/28/13  1216 History   First MD Initiated Contact with Patient 01/28/13 1247     Chief Complaint  Patient presents with  . Headache    Also has known htn   (Consider location/radiation/quality/duration/timing/severity/associated sxs/prior Treatment) The history is provided by the patient. No language interpreter was used.  Laura Summers is a 42 y/o F with PMHx of HTN, anxiety, and depression presenting to the ED with headache that has been ongoing since last night at approximately 10:00-11:00PM. Patient reported that she has been having dull headaches at least everyday for the past couple of weeks, but reported that the pain exacerbated yesterday, has gradually gotten worse last night. Patient reported that the headache is localized to the top of her head, described as a full aching sensation - reported that it feels like "brain is bleeding." Patient reported that there is no radiation of the discomfort. Patient stated that laughing makes the pain worse, while nothing makes the pain better. Patient reported that she used two ASA this morning at approximately 6:00AM without much relief. Patient reported that she has associated symptoms of nausea and photophobia. Patient reported that she used to take medications for her blood pressure, reported that she took Lisinopril-HCTZ, reported that she has not taken her medications in the past 5 months. Stated that her headaches are normally associated with elevation in blood pressure. Denied sudden onset of pain, worst headache of life, diarrhea, abdominal pain, urinary and bowel movement symptoms, numbness, tingling, weakness, phonophobia, chest pain, shortness of breath, difficulty breathing, visual distortions. PCP Dr. Eula Listen  Past Medical History  Diagnosis Date  . Hypertension   . Anxiety   . Depression    Past Surgical History  Procedure Laterality Date  . Therapeutic abortion     History reviewed. No  pertinent family history. History  Substance Use Topics  . Smoking status: Current Every Day Smoker -- 0.50 packs/day  . Smokeless tobacco: Not on file  . Alcohol Use: Yes     Comment: occasionally   OB History   Grav Para Term Preterm Abortions TAB SAB Ect Mult Living   6 2 1 1 3 3    2      Review of Systems  HENT: Negative for neck pain.   Eyes: Positive for photophobia. Negative for pain and visual disturbance.  Respiratory: Negative for chest tightness and shortness of breath.   Cardiovascular: Negative for chest pain.  Gastrointestinal: Positive for nausea. Negative for vomiting, abdominal pain, diarrhea and constipation.  Genitourinary: Negative for decreased urine volume and difficulty urinating.  Neurological: Positive for headaches. Negative for dizziness, weakness and numbness.  All other systems reviewed and are negative.    Allergies  Review of patient's allergies indicates no known allergies.  Home Medications   Current Outpatient Rx  Name  Route  Sig  Dispense  Refill  . aspirin 325 MG tablet   Oral   Take 325 mg by mouth daily.         . Norgestimate-Ethinyl Estradiol Triphasic 0.18/0.215/0.25 MG-35 MCG tablet   Oral   Take 1 tablet by mouth daily.   1 Package   11   . lisinopril-hydrochlorothiazide (PRINZIDE,ZESTORETIC) 20-12.5 MG per tablet   Oral   Take 1 tablet by mouth daily.         Marland Kitchen lisinopril-hydrochlorothiazide (PRINZIDE,ZESTORETIC) 20-12.5 MG per tablet   Oral   Take 1 tablet by mouth daily.   30 tablet  0    BP 159/87  Pulse 68  Temp(Src) 97.9 F (36.6 C) (Oral)  Resp 18  SpO2 99%  LMP 01/27/2013  Breastfeeding? Unknown Physical Exam  Nursing note and vitals reviewed. Constitutional: She is oriented to person, place, and time. She appears well-developed and well-nourished. No distress.  Patient found laying in bed with lights on as provider walked into the room  HENT:  Head: Normocephalic and atraumatic.  Negative  facial drooping  Eyes: Conjunctivae and EOM are normal. Pupils are equal, round, and reactive to light. Right eye exhibits no discharge. Left eye exhibits no discharge.  Mild discomfort when light shined in patient's eyes Negative nystagmus  Neck: Normal range of motion. Neck supple.  Cardiovascular: Normal rate, regular rhythm and normal heart sounds.  Exam reveals no friction rub.   No murmur heard. Pulses:      Radial pulses are 2+ on the right side, and 2+ on the left side.       Dorsalis pedis pulses are 2+ on the right side, and 2+ on the left side.  Pulmonary/Chest: Effort normal and breath sounds normal. No respiratory distress. She has no wheezes. She has no rales.  Musculoskeletal: Normal range of motion.  Neurological: She is alert and oriented to person, place, and time. No cranial nerve deficit or sensory deficit. She exhibits normal muscle tone. Coordination normal. GCS eye subscore is 4. GCS verbal subscore is 5. GCS motor subscore is 6.  Cranial nerves III-XII grossly intact Sensation intact to upper and lower extremities Strength 5+/5+ to upper and lower extremities bilaterally with resistance applied, equal distribution identified.  Follows commands appropriately Responds to questions appropriately Negative slurred speech Negative arm drift  Skin: Skin is warm and dry. No rash noted. She is not diaphoretic. No erythema.  Psychiatric: She has a normal mood and affect. Her behavior is normal. Thought content normal.    ED Course  Procedures (including critical care time)  Patient reported that she is not pregnant.   Labs Review Labs Reviewed - No data to display Imaging Review Ct Head Wo Contrast  01/28/2013   CLINICAL DATA:  Severe headache, dizziness  EXAM: CT HEAD WITHOUT CONTRAST  TECHNIQUE: Contiguous axial images were obtained from the base of the skull through the vertex without intravenous contrast.  COMPARISON:  None.  FINDINGS: No evidence of parenchymal  hemorrhage or extra-axial fluid collection. No mass lesion, mass effect, or midline shift.  No CT evidence of acute infarction.  Cerebral volume is within normal limits.  No ventriculomegaly.  The visualized paranasal sinuses are essentially clear. The mastoid air cells are unopacified.  No evidence of calvarial fracture.  IMPRESSION: No evidence of acute intracranial abnormality.   Electronically Signed   By: Charline Bills M.D.   On: 01/28/2013 14:42    MDM   1. Headache   2. HTN (hypertension)    Patient presenting to the ED with headache that has been ongoing for the past couple of weeks with gradual exacerbation starting last night. Reported that she does have high blood pressure, reported that she has not taken her blood pressure within 5 months. Patient reported that she used ASA today with minimal relief. Denied worst headache of life, sudden onset. Associated symptoms of nausea and photophobia.  Alert and oriented. Pleasant and interactive upon exam. Cranial nerves grossly intact. Full ROM to extremities bilaterally, upper and lower. Strength intact with equal distribution. Negative eye findings - negative nystagmus. Follows commands, responds to questions appropriately.  CT head negative findings. Doubt SAH. Doubt intracranial abnormalities/ICH. Suspicion of headache to be due to poor blood pressure control and non-compliance with medications. Pain controlled in ED setting. Patient stable, afebrile. Discharged patient with HTN medications that she used to take - discussed and educated importance of monitoring and controlling blood pressure. Referred patient to PCP to be re-evaluated next week and for blood pressure to be re-checked. Discharged patient with DASH diet. Discussed with patient to continue to monitor symptoms and if symptoms are to worsen or change to report back to the ED - strict return instructions given.  Patient agreed to plan of care, understood, all questions answered.       Raymon Mutton, PA-C 01/29/13 2057

## 2013-01-28 NOTE — Progress Notes (Signed)
[facility] provided pt with a list of primary care resources. Patient stated that she was pending Medicaid.  °

## 2013-01-28 NOTE — ED Notes (Signed)
Per pt, has had severe h/a since 10-11pm last night. Pt states that when she woke up at 4am this morning it worsened. Pt reports becoming dizzy at work. Pt states 10/10 pain at present time.

## 2013-01-30 NOTE — ED Provider Notes (Signed)
Medical screening examination/treatment/procedure(s) were performed by non-physician practitioner and as supervising physician I was immediately available for consultation/collaboration.   Massiah Minjares J. Nickey Kloepfer, MD 01/30/13 0658 

## 2013-06-24 ENCOUNTER — Ambulatory Visit
Admission: RE | Admit: 2013-06-24 | Discharge: 2013-06-24 | Disposition: A | Discharge status: Home / Self Care | Payer: Medicaid Other | Source: Ambulatory Visit | Attending: Obstetrics & Gynecology | Admitting: Obstetrics & Gynecology

## 2013-06-24 DIAGNOSIS — Z1239 Encounter for other screening for malignant neoplasm of breast: Secondary | ICD-10-CM

## 2013-07-05 ENCOUNTER — Emergency Department (HOSPITAL_COMMUNITY): Payer: No Typology Code available for payment source

## 2013-07-05 ENCOUNTER — Emergency Department (HOSPITAL_COMMUNITY)
Admission: EM | Admit: 2013-07-05 | Discharge: 2013-07-05 | Disposition: A | Discharge status: Home / Self Care | Payer: No Typology Code available for payment source | Attending: Emergency Medicine | Admitting: Emergency Medicine

## 2013-07-05 ENCOUNTER — Encounter (HOSPITAL_COMMUNITY): Payer: Self-pay | Admitting: Emergency Medicine

## 2013-07-05 DIAGNOSIS — Z8659 Personal history of other mental and behavioral disorders: Secondary | ICD-10-CM | POA: Insufficient documentation

## 2013-07-05 DIAGNOSIS — I10 Essential (primary) hypertension: Secondary | ICD-10-CM | POA: Diagnosis not present

## 2013-07-05 DIAGNOSIS — Z79899 Other long term (current) drug therapy: Secondary | ICD-10-CM | POA: Diagnosis not present

## 2013-07-05 DIAGNOSIS — S3981XA Other specified injuries of abdomen, initial encounter: Secondary | ICD-10-CM | POA: Diagnosis not present

## 2013-07-05 DIAGNOSIS — Y92410 Unspecified street and highway as the place of occurrence of the external cause: Secondary | ICD-10-CM | POA: Insufficient documentation

## 2013-07-05 DIAGNOSIS — R110 Nausea: Secondary | ICD-10-CM | POA: Diagnosis not present

## 2013-07-05 DIAGNOSIS — S40812A Abrasion of left upper arm, initial encounter: Secondary | ICD-10-CM

## 2013-07-05 DIAGNOSIS — Y9389 Activity, other specified: Secondary | ICD-10-CM | POA: Diagnosis not present

## 2013-07-05 DIAGNOSIS — S8000XA Contusion of unspecified knee, initial encounter: Secondary | ICD-10-CM | POA: Diagnosis not present

## 2013-07-05 DIAGNOSIS — IMO0002 Reserved for concepts with insufficient information to code with codable children: Secondary | ICD-10-CM | POA: Insufficient documentation

## 2013-07-05 DIAGNOSIS — S8990XA Unspecified injury of unspecified lower leg, initial encounter: Secondary | ICD-10-CM | POA: Diagnosis present

## 2013-07-05 DIAGNOSIS — S8002XA Contusion of left knee, initial encounter: Secondary | ICD-10-CM

## 2013-07-05 DIAGNOSIS — S298XXA Other specified injuries of thorax, initial encounter: Secondary | ICD-10-CM | POA: Insufficient documentation

## 2013-07-05 DIAGNOSIS — F17200 Nicotine dependence, unspecified, uncomplicated: Secondary | ICD-10-CM | POA: Insufficient documentation

## 2013-07-05 DIAGNOSIS — S99929A Unspecified injury of unspecified foot, initial encounter: Secondary | ICD-10-CM | POA: Diagnosis present

## 2013-07-05 HISTORY — DX: Low back pain, unspecified: M54.50

## 2013-07-05 HISTORY — DX: Low back pain: M54.5

## 2013-07-05 MED ORDER — IBUPROFEN 600 MG PO TABS: 600.0000 mg | ORAL_TABLET | Freq: Once | ORAL | Status: DC

## 2013-07-05 MED ORDER — IBUPROFEN 200 MG PO TABS
600.0000 mg | ORAL_TABLET | Freq: Once | ORAL | Status: AC
  Administered 2013-07-05: 600 mg via ORAL
  Filled 2013-07-05: qty 3

## 2013-07-05 MED ORDER — ONDANSETRON 4 MG PO TBDP
4.0000 mg | ORAL_TABLET | Freq: Once | ORAL | Status: AC
  Administered 2013-07-05: 4 mg via ORAL
  Filled 2013-07-05: qty 1

## 2013-07-05 NOTE — ED Provider Notes (Signed)
Medical screening examination/treatment/procedure(s) were performed by non-physician practitioner and as supervising physician I was immediately available for consultation/collaboration.   EKG Interpretation None       Analei Whinery, MD 07/05/13 2309 
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
## 2013-07-05 NOTE — ED Notes (Signed)
Pt states having some L knee pain 4/10. Abrasion noted to L upper part of arm, bruising also noted to arm.
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
## 2013-07-05 NOTE — Discharge Instructions (Signed)
Abrasion An abrasion is a cut or scrape of the skin. Abrasions do not extend through all layers of the skin and most heal within 10 days. It is important to care for your abrasion properly to prevent infection. CAUSES  Most abrasions are caused by falling on, or gliding across, the ground or other surface. When your skin rubs on something, the outer and inner layer of skin rubs off, causing an abrasion. DIAGNOSIS  Your caregiver will be able to diagnose an abrasion during a physical exam.  TREATMENT  Your treatment depends on how large and deep the abrasion is. Generally, your abrasion will be cleaned with water and a mild soap to remove any dirt or debris. An antibiotic ointment may be put over the abrasion to prevent an infection. A bandage (dressing) may be wrapped around the abrasion to keep it from getting dirty.  You may need a tetanus shot if:  You cannot remember when you had your last tetanus shot.  You have never had a tetanus shot.  The injury broke your skin. If you get a tetanus shot, your arm may swell, get red, and feel warm to the touch. This is common and not a problem. If you need a tetanus shot and you choose not to have one, there is a rare chance of getting tetanus. Sickness from tetanus can be serious.  HOME CARE INSTRUCTIONS   If a dressing was applied, change it at least once a day or as directed by your caregiver. If the bandage sticks, soak it off with warm water.   Wash the area with water and a mild soap to remove all the ointment 2 times a day. Rinse off the soap and pat the area dry with a clean towel.   Reapply any ointment as directed by your caregiver. This will help prevent infection and keep the bandage from sticking. Use gauze over the wound and under the dressing to help keep the bandage from sticking.   Change your dressing right away if it becomes wet or dirty.   Only take over-the-counter or prescription medicines for pain, discomfort, or fever as  directed by your caregiver.   Follow up with your caregiver within 24 48 hours for a wound check, or as directed. If you were not given a wound-check appointment, look closely at your abrasion for redness, swelling, or pus. These are signs of infection. SEEK IMMEDIATE MEDICAL CARE IF:   You have increasing pain in the wound.   You have redness, swelling, or tenderness around the wound.   You have pus coming from the wound.   You have a fever or persistent symptoms for more than 2 3 days.  You have a fever and your symptoms suddenly get worse.  You have a bad smell coming from the wound or dressing.  MAKE SURE YOU:   Understand these instructions.  Will watch your condition.  Will get help right away if you are not doing well or get worse. Document Released: 01/24/2005 Document Revised: 04/02/2012 Document Reviewed: 03/20/2011 [facility] Patient Information 2014 [place], [place].  Contusion A contusion is a deep bruise. Contusions happen when an injury causes bleeding under the skin. Signs of bruising include pain, puffiness (swelling), and discolored skin. The contusion may turn blue, purple, or yellow. HOME CARE   Put ice on the injured area.  Put ice in a plastic bag.  Place a towel between your skin and the bag.  Leave the ice on for 15-20 minutes, 03-04 times  a day.  Only take medicine as told by your doctor.  Rest the injured area.  If possible, raise (elevate) the injured area to lessen puffiness. GET HELP RIGHT AWAY IF:   You have more bruising or puffiness.  You have pain that is getting worse.  Your puffiness or pain is not helped by medicine. MAKE SURE YOU:   Understand these instructions.  Will watch your condition.  Will get help right away if you are not doing well or get worse. Document Released: 10/03/2007 Document Revised: 07/09/2011 Document Reviewed: 02/19/2011 [facility] Patient Information 2014 [place], [place].
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
## 2014-03-01 ENCOUNTER — Encounter (HOSPITAL_COMMUNITY): Payer: Self-pay | Admitting: Emergency Medicine

## 2014-08-05 ENCOUNTER — Other Ambulatory Visit: Payer: Self-pay | Admitting: Internal Medicine

## 2014-08-05 DIAGNOSIS — Z1231 Encounter for screening mammogram for malignant neoplasm of breast: Secondary | ICD-10-CM

## 2015-11-11 ENCOUNTER — Other Ambulatory Visit: Payer: Self-pay | Admitting: Internal Medicine

## 2015-11-11 DIAGNOSIS — Z1231 Encounter for screening mammogram for malignant neoplasm of breast: Secondary | ICD-10-CM

## 2015-11-29 ENCOUNTER — Ambulatory Visit
Admission: RE | Admit: 2015-11-29 | Discharge: 2015-11-29 | Disposition: A | Discharge status: Home / Self Care | Payer: BLUE CROSS/BLUE SHIELD | Source: Ambulatory Visit | Attending: Internal Medicine | Admitting: Internal Medicine

## 2015-11-29 DIAGNOSIS — Z1231 Encounter for screening mammogram for malignant neoplasm of breast: Secondary | ICD-10-CM

## 2016-09-24 ENCOUNTER — Encounter (HOSPITAL_COMMUNITY): Payer: Self-pay

## 2016-09-24 ENCOUNTER — Emergency Department (HOSPITAL_COMMUNITY)
Admission: EM | Admit: 2016-09-24 | Discharge: 2016-09-24 | Disposition: A | Discharge status: Home / Self Care | Payer: BLUE CROSS/BLUE SHIELD | Attending: Emergency Medicine | Admitting: Emergency Medicine

## 2016-09-24 DIAGNOSIS — I471 Supraventricular tachycardia: Secondary | ICD-10-CM | POA: Diagnosis present

## 2016-09-24 DIAGNOSIS — I10 Essential (primary) hypertension: Secondary | ICD-10-CM | POA: Insufficient documentation

## 2016-09-24 DIAGNOSIS — F17200 Nicotine dependence, unspecified, uncomplicated: Secondary | ICD-10-CM | POA: Insufficient documentation

## 2016-09-24 LAB — BASIC METABOLIC PANEL
Anion gap: 10 (ref 5–15)
BUN: 9 mg/dL (ref 6–20)
CO2: 23 mmol/L (ref 22–32)
Calcium: 8.6 mg/dL — ABNORMAL LOW (ref 8.9–10.3)
Chloride: 110 mmol/L (ref 101–111)
Creatinine, Ser: 0.79 mg/dL (ref 0.44–1.00)
GFR calc Af Amer: >60 mL/min (ref >60)
GFR calc non Af Amer: >60 mL/min (ref >60)
Glucose, Bld: 94 mg/dL (ref 65–99)
Potassium: 3.4 mmol/L — ABNORMAL LOW (ref 3.5–5.1)
Sodium: 143 mmol/L (ref 135–145)

## 2016-09-24 LAB — TSH: TSH: 0.86 u[IU]/mL (ref 0.350–4.500)

## 2016-09-24 NOTE — ED Provider Notes (Signed)
[facility]-EMERGENCY DEPT Provider Note   CSN: [id number] Arrival date & time: 09/24/16  1530  By signing my name below, I, Freida Busmaniana Omoyeni, attest that this documentation has been prepared under the direction and in the presence of Raeford RazorKohut, Ashanti Ratti, MD . Electronically Signed: Freida Busmaniana Omoyeni, Scribe. 09/24/2016. 3:49 PM.   History   Chief Complaint Chief Complaint  Patient presents with  . SVT    The history is provided by the patient. No language interpreter was used.     HPI Comments:  Laura Summers is a 46 y.o. female who presents to the Emergency Department via EMS, complaining of suden onset palpitations that began this afternoon while in the midst of a verbal altercation. She was given Adenosine en route with relief. She states she feels back to normal at this time . Pt has a h/o same. 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 She has also been  evaluated by a cardiologist and states the workup with negative. She is not currently on any cardiac meds. She denies recent increase in caffeine intake and illicit drug use. Pt has no other acute complaints or associated symptoms at this time.    Past Medical History:  Diagnosis Date  . Anxiety   . Depression   . Hypertension   . Lower back pain     Patient Active Problem List   Diagnosis Date Noted  . Complete miscarriage 11/13/2012  . ANXIETY 08/02/2006  . MIGRAINE HEADACHE 08/02/2006  . INCREASED BLOOD PRESSURE 08/02/2006    Past Surgical History:  Procedure Laterality Date  . THERAPEUTIC ABORTION      OB History    Gravida Para Term Preterm AB Living   6 2 1 1 3 2    SAB TAB Ectopic Multiple Live Births     3     2       Home Medications    Prior to Admission medications   Medication Sig Start Date End Date Taking? Authorizing Provider  ibuprofen (ADVIL,MOTRIN) 600 MG tablet Take 1  tablet (600 mg total) by mouth once. 07/05/13   Earley FavorSchulz, Gail, NP  lisinopril-hydrochlorothiazide (PRINZIDE,ZESTORETIC) 20-25 MG per tablet Take 1 tablet by mouth every evening.    [provider]  Norgestimate-Ethinyl Estradiol Triphasic 0.18/0.215/0.25 MG-35 MCG tablet Take 1 tablet by mouth daily. 12/03/12   Adam PhenixArnold, James G, MD    Family History No family history on file.  Social History Social History  Substance Use Topics  . Smoking status: Current Every Day Smoker    Packs/day: 0.50  . Smokeless tobacco: Never Used  . Alcohol use Yes     Comment: occasionally     Allergies   Patient has no known allergies.   Review of Systems Review of Systems  Respiratory: Negative for shortness of breath.   Cardiovascular: Positive for palpitations.  Neurological: Negative for syncope and weakness.  All other systems reviewed and are negative.    Physical Exam Updated Vital Signs BP 116/82 (BP Location: Right Arm)   Pulse 92   Temp 99.2 F (37.3 C) (Oral)   Resp 18   LMP 08/20/2016   SpO2 97%   Physical Exam  Constitutional: She is oriented to person, place, and time. She appears well-developed and well-nourished. No distress.  HENT:  Head: Normocephalic and atraumatic.  Eyes: EOM are normal.  Neck: Normal range of motion.  Cardiovascular: Normal rate, regular rhythm and normal heart sounds.   Pulmonary/Chest: Effort normal and breath sounds normal.  Abdominal: Soft. She exhibits no distension. There is no tenderness.  Musculoskeletal: Normal range of motion.  Neurological: She is alert and oriented to person, place, and time.  Skin: Skin is warm and dry.  Psychiatric: She has a normal mood and affect. Judgment normal.  Nursing note and vitals reviewed.    ED Treatments / Results  DIAGNOSTIC STUDIES:  Oxygen Saturation is 97% on RA, normal by my interpretation.    COORDINATION OF CARE:  3:47 PM Discussed treatment plan with pt at bedside and pt agreed to  plan.   Labs (all labs ordered are listed, but only abnormal results are displayed) Labs Reviewed  BASIC METABOLIC PANEL  TSH    EKG  EKG Interpretation  Date/Time:  Monday Sep 24 2016 15:45:52 EDT Ventricular Rate:  94 PR Interval:  156 QRS Duration: 86 QT Interval:  382 QTC Calculation: 477 R Axis:   68 Text Interpretation:  Normal sinus rhythm Possible Left atrial enlargement Borderline ECG Confirmed by Juleen China  MD, Jeannett Senior ([id number]) on 09/24/2016 5:17:28 PM       Radiology No results found.  Procedures Procedures (including critical care time)  Medications Ordered in ED Medications - No data to display   Initial Impression / Assessment and Plan / ED Course  I have reviewed the triage vital signs and the nursing notes.  Pertinent labs & imaging results that were available during my care of the patient were reviewed by me and considered in my medical decision making (see chart for details).    45yF with palpitations. Pre-hospital tracing with regular narrow complex tachycardia in 190s w/o discernable p-waves. She converted with adenosine. Now is sinus rhythm and no complaints. Will check electrolytes and TSH. Discharge with cardiology FU. She would like to arrange this through her PCP. Sounds like she has a hx of this but last event was over a year ago. I don't feel strongly about putting her on a beta blocker at this point until she follows up.   Final Clinical Impressions(s) / ED Diagnoses   Final diagnoses:  SVT (supraventricular tachycardia) (HCC)    New Prescriptions New Prescriptions   No medications on file   I personally preformed the services scribed in my presence. The recorded information has been reviewed is accurate. Raeford Razor, MD.     Raeford Razor, MD 09/30/16 [phone]

## 2016-09-24 NOTE — ED Provider Notes (Signed)
  Physical Exam  BP (!) 147/106   Pulse 90   Temp 99.2 F (37.3 C) (Oral)   Resp 20   LMP 08/20/2016   SpO2 100%   Physical Exam  ED Course  Procedures  MDM Care assumed at 5 pm. Patient here with SVT after arguing with husband. Given adenosine and remained in sinus rhythm. Sign out pending chemistry and TSH, which were unremarkable. Dr. Juleen ChinaKohut  Recommend no meds, just PCP follow up.        Laura PanderYao, Broden Holt Hsienta, MD 09/24/16 [phone]

## 2016-09-24 NOTE — ED Triage Notes (Signed)
PER EMS: pt was in argument today when she felt heart palpitations. EMS arrived to find pt in SVT with HR 200-210. Vagal maneuver unsuccessful so EMS adm 6mg  Adenosine and pt converted back to NSR rate of 94. Pt currently stating she feels "back to normal" with no complaints. HR 94 on arrival.

## 2016-09-24 NOTE — Discharge Instructions (Signed)
Follow-up with your PCP and discuss cardiology referral.

## 2016-09-24 NOTE — ED Notes (Signed)
Pt A&OX4, ambulatory at d/c with steady gait, NAD 

## 2017-01-08 ENCOUNTER — Other Ambulatory Visit: Payer: Self-pay | Admitting: Internal Medicine

## 2017-01-08 DIAGNOSIS — Z1231 Encounter for screening mammogram for malignant neoplasm of breast: Secondary | ICD-10-CM

## 2017-01-29 ENCOUNTER — Ambulatory Visit
Admission: RE | Admit: 2017-01-29 | Discharge: 2017-01-29 | Disposition: A | Discharge status: Home / Self Care | Payer: BLUE CROSS/BLUE SHIELD | Source: Ambulatory Visit | Attending: Internal Medicine | Admitting: Internal Medicine

## 2017-01-29 DIAGNOSIS — Z1231 Encounter for screening mammogram for malignant neoplasm of breast: Secondary | ICD-10-CM

## 2017-01-30 ENCOUNTER — Other Ambulatory Visit: Payer: Self-pay | Admitting: Internal Medicine

## 2017-01-30 DIAGNOSIS — R928 Other abnormal and inconclusive findings on diagnostic imaging of breast: Secondary | ICD-10-CM

## 2017-02-01 ENCOUNTER — Ambulatory Visit
Admission: RE | Admit: 2017-02-01 | Discharge: 2017-02-01 | Disposition: A | Discharge status: Home / Self Care | Payer: BLUE CROSS/BLUE SHIELD | Source: Ambulatory Visit | Attending: Internal Medicine | Admitting: Internal Medicine

## 2017-02-01 DIAGNOSIS — R928 Other abnormal and inconclusive findings on diagnostic imaging of breast: Secondary | ICD-10-CM

## 2017-12-09 ENCOUNTER — Emergency Department (HOSPITAL_COMMUNITY)
Admission: EM | Admit: 2017-12-09 | Discharge: 2017-12-09 | Disposition: A | Discharge status: Home / Self Care | Payer: BLUE CROSS/BLUE SHIELD | Attending: Emergency Medicine | Admitting: Emergency Medicine

## 2017-12-09 ENCOUNTER — Encounter (HOSPITAL_COMMUNITY): Payer: Self-pay | Admitting: Student

## 2017-12-09 ENCOUNTER — Other Ambulatory Visit: Payer: Self-pay

## 2017-12-09 DIAGNOSIS — Z79899 Other long term (current) drug therapy: Secondary | ICD-10-CM | POA: Insufficient documentation

## 2017-12-09 DIAGNOSIS — E876 Hypokalemia: Secondary | ICD-10-CM | POA: Insufficient documentation

## 2017-12-09 DIAGNOSIS — F17210 Nicotine dependence, cigarettes, uncomplicated: Secondary | ICD-10-CM | POA: Diagnosis not present

## 2017-12-09 DIAGNOSIS — I471 Supraventricular tachycardia: Secondary | ICD-10-CM | POA: Diagnosis present

## 2017-12-09 DIAGNOSIS — I10 Essential (primary) hypertension: Secondary | ICD-10-CM | POA: Insufficient documentation

## 2017-12-09 HISTORY — DX: Supraventricular tachycardia: I47.1

## 2017-12-09 HISTORY — DX: Supraventricular tachycardia, unspecified: I47.10

## 2017-12-09 LAB — I-STAT BETA HCG BLOOD, ED (MC, WL, AP ONLY)

## 2017-12-09 LAB — BASIC METABOLIC PANEL
ANION GAP: 11 (ref 5–15)
BUN: 8 mg/dL (ref 6–20)
CO2: 26 mmol/L (ref 22–32)
Calcium: 9.5 mg/dL (ref 8.9–10.3)
Chloride: 107 mmol/L (ref 98–111)
Creatinine, Ser: 0.78 mg/dL (ref 0.44–1.00)
GFR calc non Af Amer: >60 mL/min (ref >60)
GLUCOSE: 106 mg/dL — AB (ref 70–99)
POTASSIUM: 2.5 mmol/L — AB (ref 3.5–5.1)
Sodium: 144 mmol/L (ref 135–145)

## 2017-12-09 LAB — I-STAT TROPONIN, ED: Troponin i, poc: 0 ng/mL (ref 0.00–0.08)

## 2017-12-09 LAB — MAGNESIUM: Magnesium: 2.3 mg/dL (ref 1.7–2.4)

## 2017-12-09 LAB — POTASSIUM: Potassium: 3.6 mmol/L (ref 3.5–5.1)

## 2017-12-09 MED ORDER — POTASSIUM CHLORIDE 10 MEQ/100ML IV SOLN
10.0000 meq | INTRAVENOUS | Status: AC
  Administered 2017-12-09 (×2): 10 meq via INTRAVENOUS
  Filled 2017-12-09 (×2): qty 100

## 2017-12-09 MED ORDER — POTASSIUM CHLORIDE CRYS ER 20 MEQ PO TBCR: 20.0000 meq | EXTENDED_RELEASE_TABLET | Freq: Every day | ORAL | 0 refills | Status: DC

## 2017-12-09 MED ORDER — POTASSIUM CHLORIDE CRYS ER 20 MEQ PO TBCR
80.0000 meq | EXTENDED_RELEASE_TABLET | Freq: Once | ORAL | Status: AC
  Administered 2017-12-09: 80 meq via ORAL
  Filled 2017-12-09: qty 4

## 2017-12-09 NOTE — ED Notes (Signed)
Family at bedside. 

## 2017-12-09 NOTE — ED Notes (Signed)
Pt reports she was at work today when she started to have palpitations.  She took a total of 1mg  of her xanax which made her feel better.  Paramedic reports giving pt 6mg  of Adenosine en route which converted SVT to NSR.  Pt denies cp, SOB or dizziness.  She is A&Ox 4.  In NAD.  She has hx of palpitations and SVT.  Last episode was February this year.

## 2017-12-09 NOTE — ED Triage Notes (Addendum)
Per EMS, pt from work, reports palpitations with anxiety.  She has hx of SVT.  Pt is A&O x 4.  No cp or SOB.  HR was 180s on scene.  Pt  Took one of her xanax.

## 2017-12-09 NOTE — Discharge Instructions (Addendum)
You are seen in the emergency department today for palpitations which appear to have been from your supraventricular tachycardia, otherwise it is SVT.  This improved after medicines given by the EMTs.  Your labs here were notable for low Potassium level at 2.5, we replaced this and made it to that it was within normal limits at 3.6.  We are prescribing you oral potassium for the next 1 week.  We suspect that your low potassium is likely due to your blood pressure medicine.  Please follow-up with your primary care provider within 1 week for a recheck of your test given to discuss possibility of starting on potassium tablets regularly.  We would also like you to follow-up with cardiology in regards to your SVT, we have given you information for a local cardiologist.  Return to the ER for new or worsening symptoms or any other concerns.

## 2017-12-09 NOTE — ED Provider Notes (Signed)
[facility] EMERGENCY DEPARTMENT Provider Note   CSN: [id number] Arrival date & time: 12/09/17  1538     History   Chief Complaint Chief Complaint  Patient presents with  . SVT    HPI Laura Summers is a 47 y.o. female with a hx of tobacco abuse, anxiety, depression, and prior SVT who presents to the ED via EMS for palpitations which started at 14:20 and are resolved at present. Patient states that she was at work multitasking and became quite stressed and anxious. She states that she subsequently developed palpitations described as her heart racing, she then took her prescribed 0.5 mg of Xanax and laid down on the floor, put her legs up in the air, and took deep breaths as this has alleviated similar sxs int he past. She utilized her home blood pressure cuff to check her HR and it was as high as 199, it decreased to 160s, but remained persistently elevated. She took an additional 0.5 mg of Xanax without change.  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 Upon EMS arrival patient in SVT on rhythm strip, given 6 mg of IV Adenosine with conversion to NSR with occasional PVCs. Patient's symptoms are completely resolved she feels at baseline. Denies chest pain, diaphoresis, nausea, vomiting, or syncope throughout the day today.  She reports hx of episodic SVT with anxiety that occurs 2-3 times per year for past several years, she sometimes is able to convert on her own, but has required adenosine in the past. She saw cardiology in 2007 and had unremarkable work-up. She has since followed up with PCP each time, they have not started her on any preventative medications. She would like to follow up with cardiology again this time.   HPI  Past Medical History:  Diagnosis Date  . Anxiety   . Depression   . Hypertension   . Lower back pain     Patient Active Problem List   Diagnosis Date Noted  . Complete  miscarriage 11/13/2012  . ANXIETY 08/02/2006  . MIGRAINE HEADACHE 08/02/2006  . INCREASED BLOOD PRESSURE 08/02/2006    Past Surgical History:  Procedure Laterality Date  . THERAPEUTIC ABORTION       OB History    Gravida  6   Para  2   Term  1   Preterm  1   AB  3   Living  2     SAB      TAB  3   Ectopic      Multiple      Live Births  2            Home Medications    Prior to Admission medications   Medication Sig Start Date End Date Taking? Authorizing Provider  ALPRAZolam Prudy Feeler) 0.25 MG tablet Take 0.25 mg by mouth as needed for anxiety. 07/23/16   [provider]  Aspirin-Salicylamide-Caffeine (BC HEADACHE PO) Take 1 Package by mouth as needed (headache/pain).    [provider]  ibuprofen (ADVIL,MOTRIN) 600 MG tablet Take 1 tablet (600 mg total) by mouth once. Patient not taking: Reported on 09/24/2016 07/05/13   Earley Favor, NP  lisinopril-hydrochlorothiazide (PRINZIDE,ZESTORETIC) 20-25 MG per tablet Take 1 tablet by mouth every evening.    [provider]  lovastatin (MEVACOR) 40 MG tablet Take 40 mg by mouth every evening. 09/05/16   [provider]  Norgestimate-Ethinyl Estradiol Triphasic 0.18/0.215/0.25 MG-35 MCG tablet Take 1 tablet  by mouth daily. Patient not taking: Reported on 09/24/2016 12/03/12   Adam PhenixArnold, James G, MD    Family History No family history on file.  Social History Social History   Tobacco Use  . Smoking status: Current Every Day Smoker    Packs/day: 0.50  . Smokeless tobacco: Never Used  Substance Use Topics  . Alcohol use: Yes    Comment: occasionally  . Drug use: Not on file     Allergies   Patient has no known allergies.   Review of Systems Review of Systems  Constitutional: Negative for chills, diaphoresis and fever.  Respiratory: Positive for shortness of breath (resolved at present).   Cardiovascular: Positive for palpitations (resolved at present). Negative for chest pain.   Gastrointestinal: Negative for nausea and vomiting.  Neurological: Positive for light-headedness (resolved at present). Negative for syncope.  All other systems reviewed and are negative.   Physical Exam Updated Vital Signs BP (!) 143/106   Pulse 76   Temp 98.3 F (36.8 C) (Oral)   Resp 14   Ht 5\' 10"  (1.778 m)   Wt 79.4 kg   SpO2 99%   BMI 25.11 kg/m   Physical Exam  Constitutional: She appears well-developed and well-nourished.  Non-toxic appearance. No distress.  HENT:  Head: Normocephalic and atraumatic.  Eyes: Conjunctivae are normal. Right eye exhibits no discharge. Left eye exhibits no discharge.  Neck: Neck supple.  Cardiovascular: Normal rate and regular rhythm. Exam reveals no gallop and no friction rub.  No murmur heard. Pulses:      Radial pulses are 2+ on the right side, and 2+ on the left side.  Pulmonary/Chest: Effort normal and breath sounds normal. No respiratory distress. She has no wheezes. She has no rhonchi. She has no rales.  Respiration even and unlabored  Abdominal: Soft. She exhibits no distension. There is no tenderness.  Musculoskeletal: She exhibits no edema or tenderness.  Neurological: She is alert.  Clear speech.   Skin: Skin is warm and dry. No rash noted.  Psychiatric: She has a normal mood and affect. Her behavior is normal.  Nursing note and vitals reviewed.   ED Treatments / Results  Labs Results for orders placed or performed during the hospital encounter of 12/09/17  Basic metabolic panel  Result Value Ref Range   Sodium 144 135 - 145 mmol/L   Potassium 2.5 (LL) 3.5 - 5.1 mmol/L   Chloride 107 98 - 111 mmol/L   CO2 26 22 - 32 mmol/L   Glucose, Bld 106 (H) 70 - 99 mg/dL   BUN 8 6 - 20 mg/dL   Creatinine, Ser 4.090.78 0.44 - 1.00 mg/dL   Calcium 9.5 8.9 - 81.110.3 mg/dL   GFR calc non Af Amer >60 >60 mL/min   GFR calc Af Amer >60 >60 mL/min   Anion gap 11 5 - 15  Magnesium  Result Value Ref Range   Magnesium 2.3 1.7 - 2.4 mg/dL    Potassium  Result Value Ref Range   Potassium 3.6 3.5 - 5.1 mmol/L  I-stat troponin, ED  Result Value Ref Range   Troponin i, poc 0.00 0.00 - 0.08 ng/mL   Comment 3          I-Stat beta hCG blood, ED  Result Value Ref Range   I-stat hCG, quantitative <5.0 <5 mIU/mL   Comment 3           No results found.  EKG EKG Interpretation  Date/Time:  Monday December 09 2017  16:15:43 EDT Ventricular Rate:  82 PR Interval:    QRS Duration: 97 QT Interval:  392 QTC Calculation: 458 R Axis:   49 Text Interpretation:  Sinus rhythm Probable left atrial enlargement No significant change since last tracing Confirmed by Gwyneth SproutPlunkett, Whitney ([id number]) on 12/09/2017 4:55:37 PM   Radiology No results found.  Procedures Procedures (including critical care time)  CRITICAL CARE Performed by: Harvie HeckSamantha Amun Stemm   Total critical care time: 35 minutes  Critical care time was exclusive of separately billable procedures and treating other patients.  Critical care was necessary to treat or prevent imminent or life-threatening deterioration.  Critical care was time spent personally by me on the following activities: development of treatment plan with patient and/or surrogate as well as nursing, discussions with consultants, evaluation of patient's response to treatment, examination of patient, obtaining history from patient or surrogate, ordering and performing treatments and interventions, ordering and review of laboratory studies, ordering and review of radiographic studies, pulse oximetry and re-evaluation of patient's condition.  Medications Ordered in ED Medications  potassium chloride SA (K-DUR,KLOR-CON) CR tablet 80 mEq (80 mEq Oral Given 12/09/17 1801)  potassium chloride 10 mEq in 100 mL IVPB (0 mEq Intravenous Stopped 12/09/17 2025)     Initial Impression / Assessment and Plan / ED Course  I have reviewed the triage vital signs and the nursing notes.  Pertinent labs & imaging results that  were available during my care of the patient were reviewed by me and considered in my medical decision making (see chart for details).   Patient presents to the ED via EMS due to SVT which has converted in route s/p 6 mg of adenosine, I personally reviewed rhythm strip by EMS  Upon arrival patient resting comfortably without complaints and benign physical exam.  EKG with normal sinus rhythm, no significant change from previous.  Patient is not pregnant. Basic metabolic panel notable for hypokalemia at 2.5, QTc 458, will check magnesium level, and orally/parenterally replace.   Magnesium WNL. Potassium normalized to 3.6 with replacement in the ED, will start on PO potassium- supervising physician Dr. Anitra LauthPlunkett recommends 1 week supplementation, suspect hypokalemia due to hydrochlorothiazide, PCP recheck.  Regarding her SVT-she has history of similar episodes related to stress/anxiety, she has been in normal sinus rhythm on the monitor in the emergency department with occasional PVC, will have her follow-up with cardiology.  Patient appears hedynamically stable and safe for discharge. I discussed results, treatment plan, need for  follow-up, and return precautions with the patient. Provided opportunity for questions, patient confirmed understanding and is in agreement with plan.   Findings and plan of care discussed with supervising physician Dr. Anitra LauthPlunkett- in agreement.   Vitals:   12/09/17 2000 12/09/17 2100  BP: (!) 143/106 (!) 149/107  Pulse: 76 65  Resp: 14 19  Temp:    SpO2: 99% 100%    Final Clinical Impressions(s) / ED Diagnoses   Final diagnoses:  SVT (supraventricular tachycardia) (HCC)  Hypokalemia    ED Discharge Orders         Ordered    potassium chloride SA (K-DUR,KLOR-CON) 20 MEQ tablet  Daily     12/09/17 2147           Cherly Andersonetrucelli, Shanora Christensen R, PA-C 12/09/17 2340    Gwyneth SproutPlunkett, Whitney, MD 12/09/17 [phone]

## 2017-12-10 ENCOUNTER — Telehealth: Payer: Self-pay | Admitting: Emergency Medicine

## 2017-12-10 NOTE — Telephone Encounter (Signed)
[facility] a [facility] prescription for potassium chloride written on 12/09/2017 during [facility].  Prescription was written for 1 table daily for 7 days but with a total of 77 tablets.  On chart review EDPA's note clearly states she wants pt to have 1 week of supplementation.  Updated pharmacy that prescription should be for 7 tablets not 77 tablets.  No further CM needs noted at this time.

## 2017-12-17 ENCOUNTER — Telehealth: Payer: Self-pay | Admitting: *Deleted

## 2017-12-17 NOTE — Telephone Encounter (Signed)
[facility] Dr. Quitman LivingsSami Hassan [phone], notes requested.

## 2017-12-27 ENCOUNTER — Telehealth: Payer: Self-pay

## 2017-12-27 NOTE — Telephone Encounter (Signed)
Notes on file. [facility]

## 2018-01-28 ENCOUNTER — Encounter: Payer: Self-pay | Admitting: Cardiovascular Disease

## 2018-01-28 ENCOUNTER — Ambulatory Visit (INDEPENDENT_AMBULATORY_CARE_PROVIDER_SITE_OTHER): Payer: BLUE CROSS/BLUE SHIELD | Admitting: Cardiovascular Disease

## 2018-01-28 VITALS — BP 138/78 | HR 66 | Ht 70.0 in | Wt 179.8 lb

## 2018-01-28 DIAGNOSIS — I10 Essential (primary) hypertension: Secondary | ICD-10-CM | POA: Insufficient documentation

## 2018-01-28 DIAGNOSIS — E7800 Pure hypercholesterolemia, unspecified: Secondary | ICD-10-CM

## 2018-01-28 DIAGNOSIS — I471 Supraventricular tachycardia: Secondary | ICD-10-CM | POA: Diagnosis not present

## 2018-01-28 DIAGNOSIS — E785 Hyperlipidemia, unspecified: Secondary | ICD-10-CM | POA: Insufficient documentation

## 2018-01-28 NOTE — Assessment & Plan Note (Signed)
Essential hypertension her blood pressure measured today 130/78.  She is on atenolol, lisinopril and hydrochlorothiazide.  Continue current meds at current dosing.

## 2018-01-28 NOTE — Assessment & Plan Note (Signed)
History of hyperlipidemia on statin therapy followed by her PCP. 

## 2018-01-28 NOTE — Progress Notes (Signed)
01/28/2018 Laura Summers   Dec 27, 1970  [id number]  Primary Physician Georgann Housekeeper, MD Primary Cardiologist: Runell Gess MD Nicholes Calamity, [place]  HPI:  Laura Summers is a 47 y.o. 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  She was referred by [facility] emergency room, Harvie Heck, PA.C, for cardiovascular valuation because of recent episode of PSVT.  She does have a history of tobacco abuse having smoked 1/2 pack/day for last 30 years as well as treated hypertension hyperlipidemia.  She walks daily.  She does not drink caffeine.  She is never had a heart attack or stroke and denies chest pain or shortness of breath.  There is no family history of heart disease.  She has had episodes of PSVT's for 10 years occurring once or twice a year usually self terminated with Valsalva or vagal maneuvers.  She has been seen in the ER on several occasions most recently on 12/09/2017 at which time she received IV adenosine and terminated.  During these episodes she does not feel chest pain, shortness of breath or presyncope.   Current Meds  Medication Sig  . ALPRAZolam (XANAX) 0.25 MG tablet Take 0.25 mg by mouth 3 (three) times daily as needed for anxiety.   . Aspirin-Salicylamide-Caffeine (BC HEADACHE PO) Take 1 Package by mouth as needed (headache/pain).  Marland Kitchen atenolol (TENORMIN) 25 MG tablet Take 25 mg by mouth daily.  Marland Kitchen lisinopril-hydrochlorothiazide (PRINZIDE,ZESTORETIC) 20-25 MG per tablet Take 1 tablet by mouth every evening.  . lovastatin (MEVACOR) 40 MG tablet Take 40 mg by mouth every evening.     No Known Allergies  Social History   Socioeconomic History  . Marital status: Divorced    Spouse name: Not on file  . Number of children: Not on file  . Years of education: Not on file  . Highest education level: Not on file  Occupational History  . Not on file    Social Needs  . Financial resource strain: Not on file  . Food insecurity:    Worry: Not on file    Inability: Not on file  . Transportation needs:    Medical: Not on file    Non-medical: Not on file  Tobacco Use  . Smoking status: Current Every Day Smoker    Packs/day: 0.50  . Smokeless tobacco: Never Used  Substance and Sexual Activity  . Alcohol use: Yes    Alcohol/week: 4.0 standard drinks    Types: 4 Shots of liquor per week    Comment: daily  . Drug use: Never  . Sexual activity: Yes  Lifestyle  . Physical activity:    Days per week: Not on file    Minutes per session: Not on file  . Stress: Not on file  Relationships  . Social connections:    Talks on phone: Not on file    Gets together: Not on file    Attends religious service: Not on file    Active member of club or organization: Not on file    Attends meetings of clubs or organizations: Not on file    Relationship status: Not on file  . Intimate partner violence:    Fear of current or ex partner: Not on file    Emotionally abused: Not on file    Physically abused: Not on file    Forced sexual activity: Not on file  Other Topics  Concern  . Not on file  Social History Narrative  . Not on file     Review of Systems: General: negative for chills, fever, night sweats or weight changes.  Cardiovascular: negative for chest pain, dyspnea on exertion, edema, orthopnea, palpitations, paroxysmal nocturnal dyspnea or shortness of breath Dermatological: negative for rash Respiratory: negative for cough or wheezing Urologic: negative for hematuria Abdominal: negative for nausea, vomiting, diarrhea, bright red blood per rectum, melena, or hematemesis Neurologic: negative for visual changes, syncope, or dizziness All other systems reviewed and are otherwise negative except as noted above.    Blood pressure 138/78, pulse 66, height 5\' 10"  (1.778 m), weight 179 lb 12.8 oz (81.6 kg), unknown if currently breastfeeding.   General appearance: alert and no distress Neck: no adenopathy, no carotid bruit, no JVD, supple, symmetrical, trachea midline and thyroid not enlarged, symmetric, no tenderness/mass/nodules Lungs: clear to auscultation bilaterally Heart: regular rate and rhythm, S1, S2 normal, no murmur, click, rub or gallop Extremities: extremities normal, atraumatic, no cyanosis or edema Pulses: 2+ and symmetric Skin: Skin color, texture, turgor normal. No rashes or lesions Neurologic: Alert and oriented X 3, normal strength and tone. Normal symmetric reflexes. Normal coordination and gait  EKG sinus rhythm at 66 without ST or T wave changes.  Personally reviewed this EKG.  ASSESSMENT AND PLAN:   Hyperlipidemia History of hyperlipidemia on statin therapy followed by her PCP  Essential hypertension Essential hypertension her blood pressure measured today 130/78.  She is on atenolol, lisinopril and hydrochlorothiazide.  Continue current meds at current dosing.  PSVT (paroxysmal supraventricular tachycardia) [facility]) [name] was recently seen in the emergency room on 12/09/2017 with PSVT which resolved with intravenous adenosine.  She is had these episodes for 10 years which occur once or twice a year and resolved spontaneously or with reflux maneuvers.  She does not drink caffeine.  There usually anxiety related.  She has been seen in the ER on several occasions.  Given the relative infrequency of the episodes I do not think referral to an EP physician is appropriate at this time.  Should her symptoms become more recurrent and or severe consideration of ablation will be will be made.      Runell Gess MD FACP,FACC,FAHA, [facility] 01/28/2018 8:58 AM

## 2018-01-28 NOTE — Patient Instructions (Signed)
Medication Instructions:  Your physician recommends that you continue on your current medications as directed. Please refer to the Current Medication list given to you today.   Labwork: none  Testing/Procedures: none  Follow-Up: Follow up with Dr. Berry as needed.   Any Other Special Instructions Will Be Listed Below (If Applicable).     If you need a refill on your cardiac medications before your next appointment, please call your pharmacy.   

## 2018-01-28 NOTE — Assessment & Plan Note (Signed)
Laura Summers was recently seen in the emergency room on 12/09/2017 with PSVT which resolved with intravenous adenosine.  She is had these episodes for 10 years which occur once or twice a year and resolved spontaneously or with reflux maneuvers.  She does not drink caffeine.  There usually anxiety related.  She has been seen in the ER on several occasions.  Given the relative infrequency of the episodes I do not think referral to an EP physician is appropriate at this time.  Should her symptoms become more recurrent and or severe consideration of ablation will be will be made.

## 2018-03-17 ENCOUNTER — Other Ambulatory Visit: Payer: Self-pay | Admitting: Internal Medicine

## 2018-03-17 DIAGNOSIS — Z1231 Encounter for screening mammogram for malignant neoplasm of breast: Secondary | ICD-10-CM

## 2018-04-04 ENCOUNTER — Ambulatory Visit
Admission: RE | Admit: 2018-04-04 | Discharge: 2018-04-04 | Disposition: A | Discharge status: Home / Self Care | Payer: BLUE CROSS/BLUE SHIELD | Source: Ambulatory Visit | Attending: Internal Medicine | Admitting: Internal Medicine

## 2018-04-04 DIAGNOSIS — Z1231 Encounter for screening mammogram for malignant neoplasm of breast: Secondary | ICD-10-CM

## 2019-04-14 ENCOUNTER — Other Ambulatory Visit: Payer: Self-pay | Admitting: Internal Medicine

## 2019-04-14 DIAGNOSIS — Z1231 Encounter for screening mammogram for malignant neoplasm of breast: Secondary | ICD-10-CM

## 2019-06-10 ENCOUNTER — Ambulatory Visit
Admission: RE | Admit: 2019-06-10 | Discharge: 2019-06-10 | Disposition: A | Discharge status: Home / Self Care | Payer: BC Managed Care – PPO | Source: Ambulatory Visit | Attending: Internal Medicine | Admitting: Internal Medicine

## 2019-06-10 ENCOUNTER — Other Ambulatory Visit: Payer: Self-pay

## 2019-06-10 DIAGNOSIS — Z1231 Encounter for screening mammogram for malignant neoplasm of breast: Secondary | ICD-10-CM

## 2020-01-17 ENCOUNTER — Encounter (HOSPITAL_BASED_OUTPATIENT_CLINIC_OR_DEPARTMENT_OTHER): Payer: Self-pay

## 2020-01-17 ENCOUNTER — Other Ambulatory Visit: Payer: Self-pay

## 2020-01-17 ENCOUNTER — Emergency Department (HOSPITAL_BASED_OUTPATIENT_CLINIC_OR_DEPARTMENT_OTHER)
Admission: EM | Admit: 2020-01-17 | Discharge: 2020-01-18 | Disposition: A | Discharge status: Home / Self Care | Payer: BC Managed Care – PPO | Attending: Emergency Medicine | Admitting: Emergency Medicine

## 2020-01-17 DIAGNOSIS — F17200 Nicotine dependence, unspecified, uncomplicated: Secondary | ICD-10-CM | POA: Diagnosis not present

## 2020-01-17 DIAGNOSIS — I10 Essential (primary) hypertension: Secondary | ICD-10-CM | POA: Diagnosis not present

## 2020-01-17 DIAGNOSIS — Z20822 Contact with and (suspected) exposure to covid-19: Secondary | ICD-10-CM | POA: Diagnosis not present

## 2020-01-17 DIAGNOSIS — Z79899 Other long term (current) drug therapy: Secondary | ICD-10-CM | POA: Insufficient documentation

## 2020-01-17 DIAGNOSIS — E669 Obesity, unspecified: Secondary | ICD-10-CM | POA: Diagnosis not present

## 2020-01-17 DIAGNOSIS — Z7982 Long term (current) use of aspirin: Secondary | ICD-10-CM | POA: Diagnosis not present

## 2020-01-17 DIAGNOSIS — R0981 Nasal congestion: Secondary | ICD-10-CM | POA: Diagnosis present

## 2020-01-17 LAB — SARS CORONAVIRUS 2 BY RT PCR (HOSPITAL ORDER, PERFORMED IN ~~LOC~~ HOSPITAL LAB): SARS Coronavirus 2: NEGATIVE

## 2020-01-17 NOTE — ED Provider Notes (Signed)
[facility] EMERGENCY DEPARTMENT Provider Note   CSN: [id number] Arrival date & time: 01/17/20  1817     History Chief Complaint  Patient presents with  . Covid Exposure    Laura Summers is a 49 y.o. female.  HPI     This a 49 year old female with a history of anxiety, hypertension, SVT who presents with concerns for possible COVID-19.  She reports that her daughter who she lives with tested positive for COVID-19.  She reports her last several days she has had some "minor cold symptoms."  She reports congestion.  No shortness of breath.  No cough or fever.  No loss of sense of taste or smell.  She is fully vaccinated against COVID-19.  She states that she just wants to get tested to ensure that she is negative.  Past Medical History:  Diagnosis Date  . Anxiety   . Depression   . Hypertension   . Lower back pain   . SVT (supraventricular tachycardia) [facility])     Patient Active Problem List   Diagnosis Date Noted  . PSVT (paroxysmal supraventricular tachycardia) (HCC) 01/28/2018  . Essential hypertension 01/28/2018  . Hyperlipidemia 01/28/2018  . Complete miscarriage 11/13/2012  . ANXIETY 08/02/2006  . MIGRAINE HEADACHE 08/02/2006  . INCREASED BLOOD PRESSURE 08/02/2006    Past Surgical History:  Procedure Laterality Date  . THERAPEUTIC ABORTION       OB History    Gravida  6   Para  2   Term  1   Preterm  1   AB  3   Living  2     SAB      TAB  3   Ectopic      Multiple      Live Births  2           No family history on file.  Social History   Tobacco Use  . Smoking status: Current Every Day Smoker    Packs/day: 0.50  . Smokeless tobacco: Never Used  Substance Use Topics  . Alcohol use: Yes    Alcohol/week: 4.0 standard drinks    Types: 4 Shots of liquor per week    Comment: daily  . Drug use: Never    Home Medications Prior to Admission medications   Medication Sig Start Date End Date Taking? Authorizing  Provider  ALPRAZolam (XANAX) 0.25 MG tablet Take 0.25 mg by mouth 3 (three) times daily as needed for anxiety.  07/23/16   [provider]  Aspirin-Salicylamide-Caffeine (BC HEADACHE PO) Take 1 Package by mouth as needed (headache/pain).    [provider]  atenolol (TENORMIN) 25 MG tablet Take 25 mg by mouth daily. 01/14/18   [provider]  lisinopril-hydrochlorothiazide (PRINZIDE,ZESTORETIC) 20-25 MG per tablet Take 1 tablet by mouth every evening.    [provider]  lovastatin (MEVACOR) 40 MG tablet Take 40 mg by mouth every evening. 09/05/16   [provider]    Allergies    Patient has no known allergies.  Review of Systems   Review of Systems  Constitutional: Negative for fever.  HENT: Positive for congestion.   Respiratory: Negative for shortness of breath.   Cardiovascular: Negative for chest pain.  Gastrointestinal: Negative for abdominal pain, nausea and vomiting.  All other systems reviewed and are negative.   Physical Exam Updated Vital Signs BP (!) 169/99 (BP Location: Right Arm)   Pulse 68   Temp 98.7 F (37.1 C) (Oral)   Resp 20  Ht 1.778 m (5\' 10" )   Wt 91.6 kg   LMP 01/12/2017   SpO2 99%   BMI 28.98 kg/m   Physical Exam Vitals and nursing note reviewed.  Constitutional:      Appearance: She is well-developed. She is obese. She is not ill-appearing.  HENT:     Head: Normocephalic and atraumatic.     Nose: Nose normal.     Mouth/Throat:     Mouth: Mucous membranes are moist.  Eyes:     Pupils: Pupils are equal, round, and reactive to light.  Cardiovascular:     Rate and Rhythm: Normal rate and regular rhythm.     Heart sounds: Normal heart sounds.  Pulmonary:     Effort: Pulmonary effort is normal. No respiratory distress.     Breath sounds: No wheezing.  Abdominal:     Palpations: Abdomen is soft.     Tenderness: There is no abdominal tenderness.  Musculoskeletal:     Cervical back: Neck supple.    Skin:    General: Skin is warm and dry.  Neurological:     Mental Status: She is alert and oriented to person, place, and time.  Psychiatric:        Mood and Affect: Mood normal.     ED Results / Procedures / Treatments   Labs (all labs ordered are listed, but only abnormal results are displayed) Labs Reviewed  SARS CORONAVIRUS 2 BY RT PCR (HOSPITAL ORDER, PERFORMED IN [facility])    EKG None  Radiology No results found.  Procedures Procedures (including critical care time)  Medications Ordered in ED Medications - No data to display  ED Course  I have reviewed the triage vital signs and the nursing notes.  Pertinent labs & imaging results that were available during my care of the patient were reviewed by me and considered in my medical decision making (see chart for details).    MDM Rules/Calculators/A&P                           Patient presents requesting COVID-19 testing.  Positive contact and upper respiratory symptoms.  She is overall nontoxic and vital signs are notable for blood pressure 169/99.  Physical exam is benign.  Covid testing sent and is negative.  She is vaccinated.  Recommend that she follow current CDC quarantine guidelines for vaccinated patient given that she lives with her daughter.  Patient stated understanding.  After history, exam, and medical workup I feel the patient has been appropriately medically screened and is safe for discharge home. Pertinent diagnoses were discussed with the patient. Patient was given return precautions.   Laura Summers was evaluated in Emergency Department on 01/17/2020 for the symptoms described in the history of present illness. She was evaluated in the context of the global COVID-19 pandemic, which necessitated consideration that the patient might be at risk for infection with the SARS-CoV-2 virus that causes COVID-19. Institutional protocols and algorithms that pertain to the evaluation of  patients at risk for COVID-19 are in a state of rapid change based on information released by regulatory bodies including the CDC and federal and state organizations. These policies and algorithms were followed during the patient's care in the ED.   Final Clinical Impression(s) / ED Diagnoses Final diagnoses:  Close exposure to COVID-19 virus    Rx / DC Orders ED Discharge Orders    None       Platon Arocho,  Mayer Masker, MD 01/17/20 2352

## 2020-01-17 NOTE — ED Notes (Signed)
Pt reports sleeping under an air conditioner and thinks that caused her symptoms.

## 2020-01-17 NOTE — ED Triage Notes (Signed)
Pt arrives wanting covid test r/t 2 family members testing positive, states she has had "minor cold symptoms"

## 2020-01-17 NOTE — Discharge Instructions (Addendum)
You were seen today and tested for COVID-19.  Your test is negative.  Given that you live with her daughter and have close contact, you should quarantine as you still have opportunity to contract and transmit the virus.

## 2021-08-28 ENCOUNTER — Other Ambulatory Visit: Payer: Self-pay | Admitting: Internal Medicine

## 2021-08-28 DIAGNOSIS — Z1231 Encounter for screening mammogram for malignant neoplasm of breast: Secondary | ICD-10-CM

## 2021-09-22 ENCOUNTER — Ambulatory Visit
Admission: RE | Admit: 2021-09-22 | Discharge: 2021-09-22 | Disposition: A | Discharge status: Home / Self Care | Payer: BC Managed Care – PPO | Source: Ambulatory Visit | Attending: Internal Medicine | Admitting: Internal Medicine

## 2021-09-22 DIAGNOSIS — Z1231 Encounter for screening mammogram for malignant neoplasm of breast: Secondary | ICD-10-CM

## 2022-07-15 IMAGING — MG MM DIGITAL SCREENING BILAT W/ TOMO AND CAD
8 series · 8 of 24 positions shown · non-contrast
Comparison: Previous exam(s).

CLINICAL DATA: Screening.

EXAM:
DIGITAL SCREENING BILATERAL MAMMOGRAM WITH TOMOSYNTHESIS AND CAD
TECHNIQUE: Bilateral screening digital craniocaudal and mediolateral oblique
mammograms were obtained. Bilateral screening digital breast
tomosynthesis was performed. The images were evaluated with
computer-aided detection.

[L CC synth-2D]
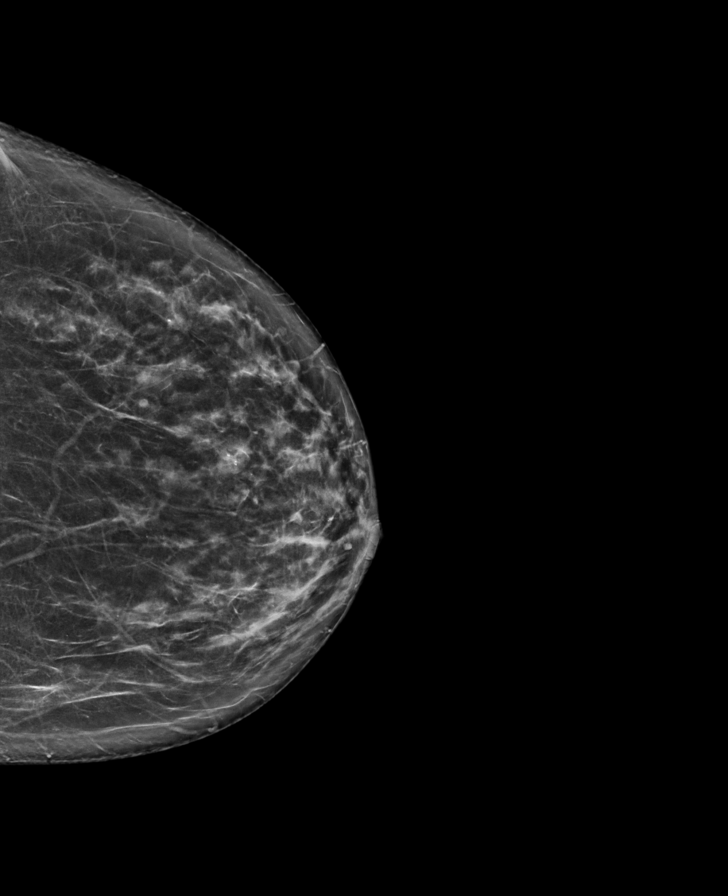

[R MLO synth-2D]
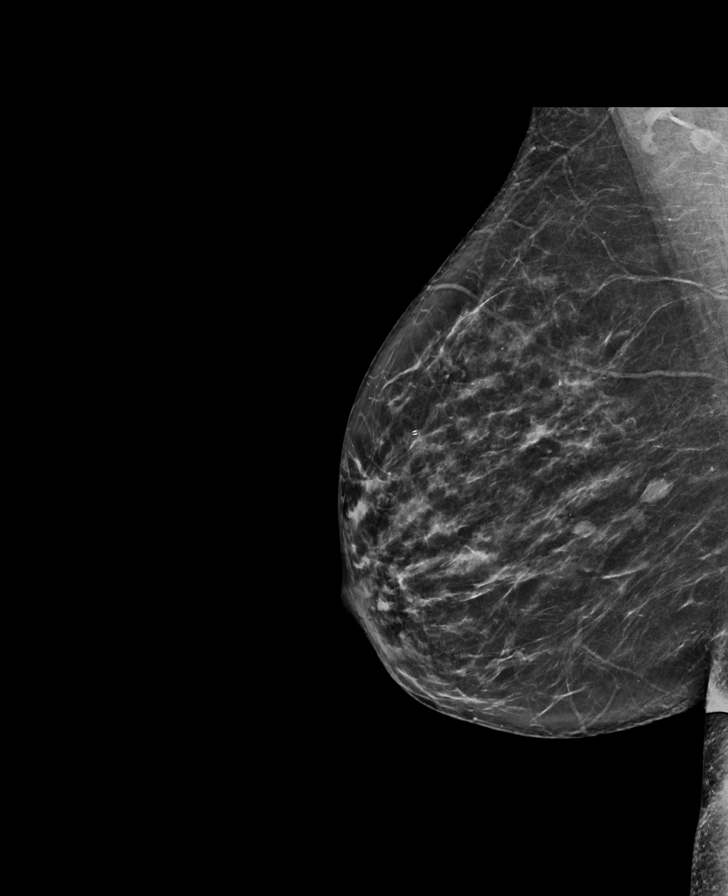

[L MLO synth-2D]
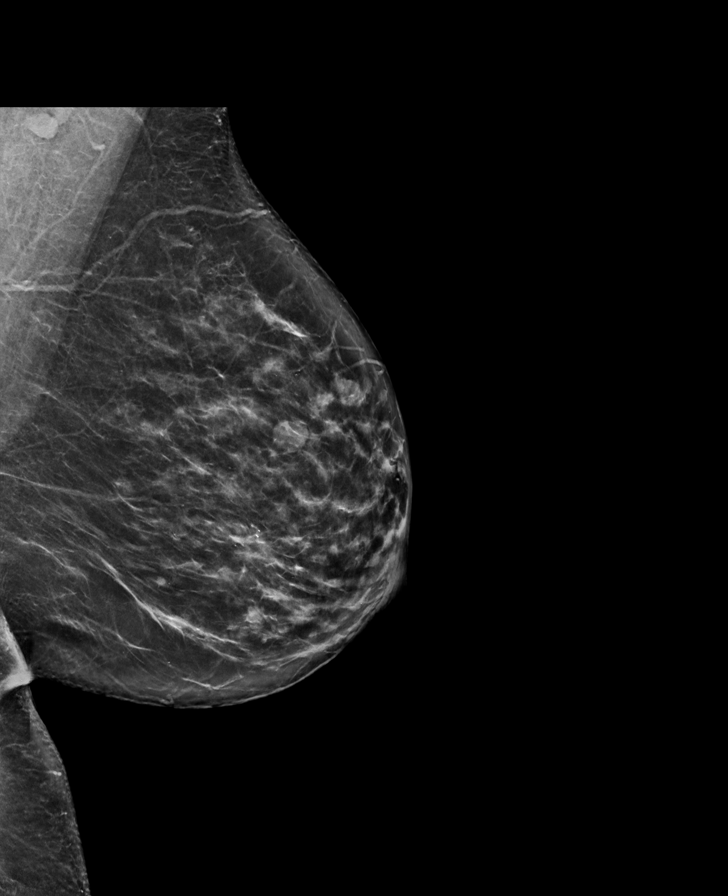

[R CC synth-2D]
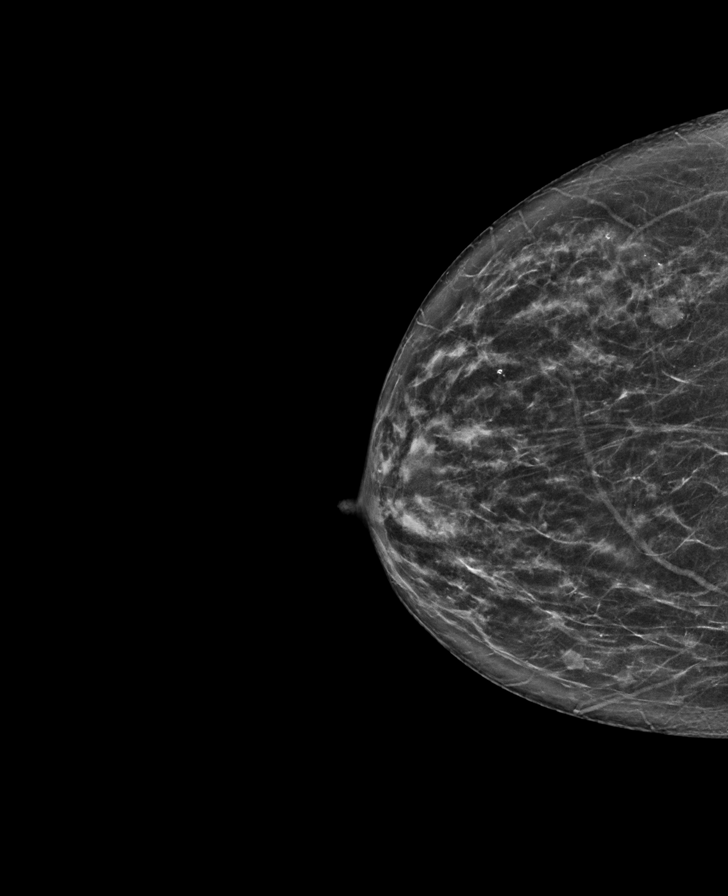

[R CC tomo · tomo slice 35/69.0]
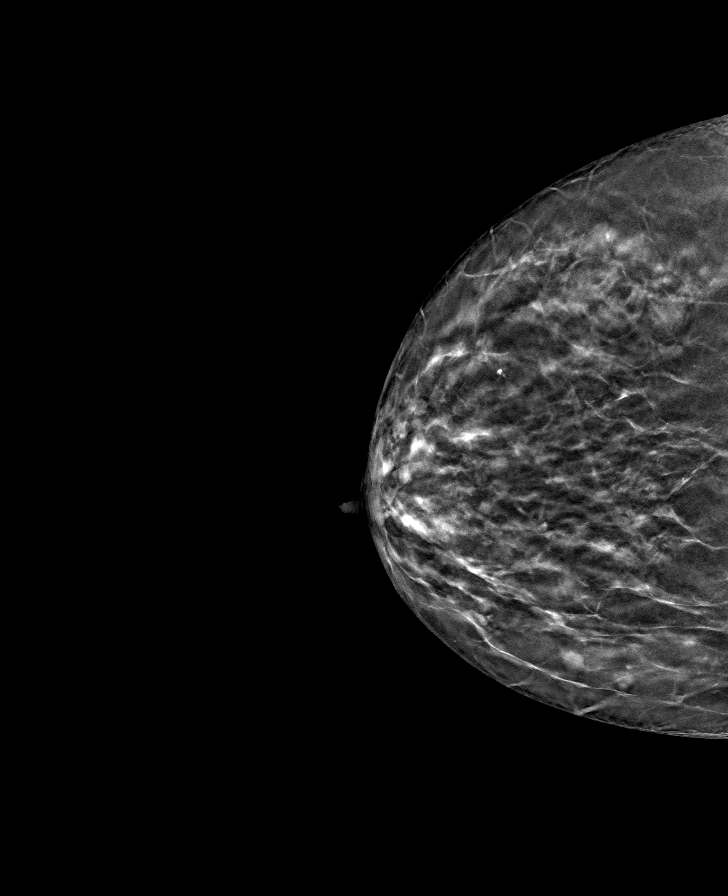

[R MLO tomo · tomo slice 37/73.0]
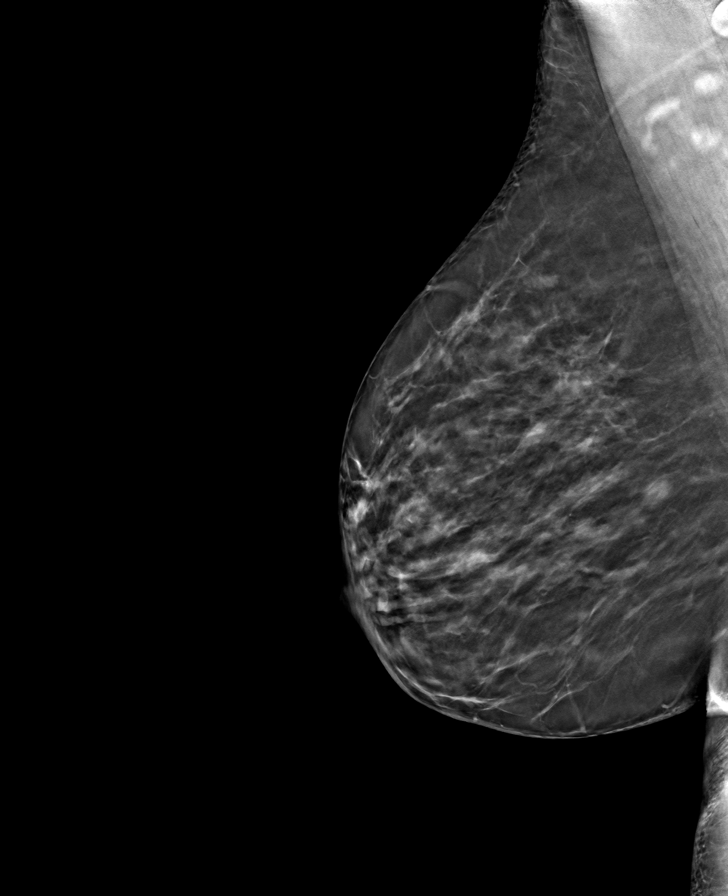

[L MLO tomo · tomo slice 39/78.0]
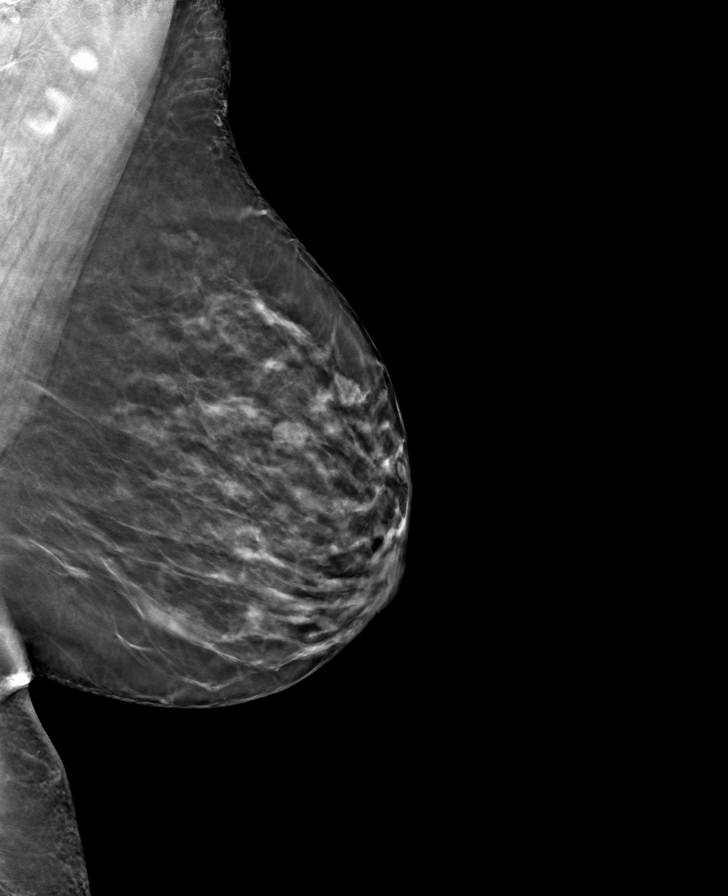

[L CC tomo · tomo slice 37/72.0]
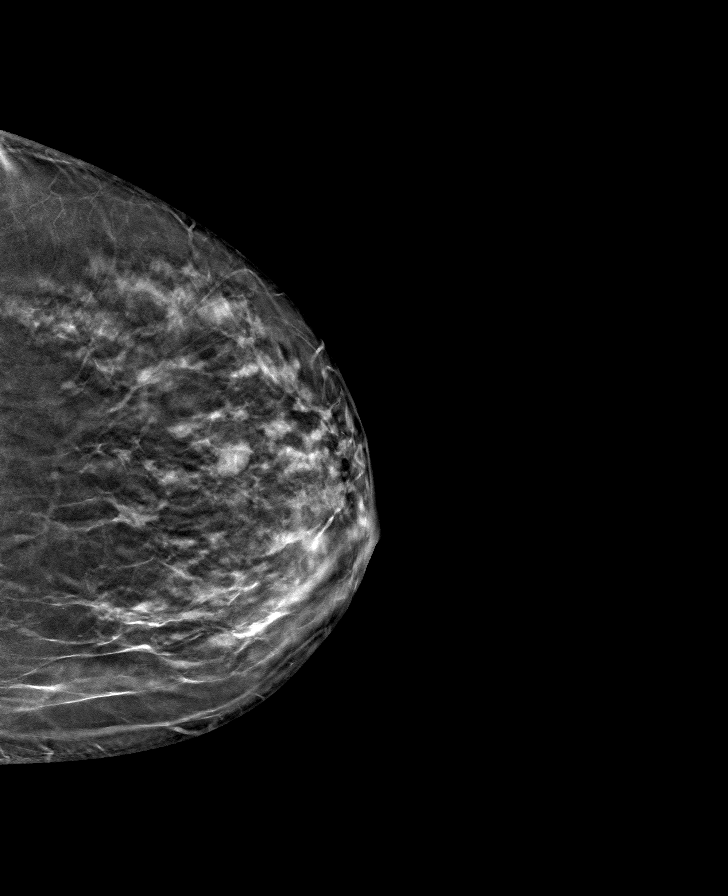

[8 of 24 positions shown; findings below may reference images not displayed]

ACR Breast Density Category c: The breast tissue is heterogeneously
dense, which may obscure small masses.
FINDINGS: There are no findings suspicious for malignancy.
IMPRESSION: No mammographic evidence of malignancy. A result letter of this
screening mammogram will be mailed directly to the patient.

RECOMMENDATION:
Screening mammogram in one year. (Code:Q3-W-BC3)

BI-RADS CATEGORY  1: Negative.

## 2022-11-22 ENCOUNTER — Other Ambulatory Visit: Payer: Self-pay | Admitting: Internal Medicine

## 2022-11-22 DIAGNOSIS — Z1231 Encounter for screening mammogram for malignant neoplasm of breast: Secondary | ICD-10-CM

## 2022-12-07 ENCOUNTER — Ambulatory Visit: Payer: BC Managed Care – PPO
